# Patient Record
Sex: Female | Born: 1960 | Race: White | Hispanic: No | Marital: Married | Smoking: Never smoker
Health system: Southern US, Community
[De-identification: ages and names within clinical notes are randomized; demographics above are authoritative.]

## PROBLEM LIST (undated history)

## (undated) DIAGNOSIS — M1990 Unspecified osteoarthritis, unspecified site: Secondary | ICD-10-CM

## (undated) DIAGNOSIS — M8580 Other specified disorders of bone density and structure, unspecified site: Secondary | ICD-10-CM

## (undated) DIAGNOSIS — Z923 Personal history of irradiation: Secondary | ICD-10-CM

## (undated) DIAGNOSIS — C50919 Malignant neoplasm of unspecified site of unspecified female breast: Secondary | ICD-10-CM

## (undated) DIAGNOSIS — E785 Hyperlipidemia, unspecified: Secondary | ICD-10-CM

## (undated) HISTORY — PX: OTHER SURGICAL HISTORY: SHX169

## (undated) HISTORY — DX: Unspecified osteoarthritis, unspecified site: M19.90

## (undated) HISTORY — DX: Other specified disorders of bone density and structure, unspecified site: M85.80

## (undated) HISTORY — PX: PILONIDAL CYST EXCISION: SHX744

## (undated) HISTORY — DX: Hyperlipidemia, unspecified: E78.5

## (undated) HISTORY — PX: BREAST SURGERY: SHX581

## (undated) HISTORY — PX: COLONOSCOPY: SHX174

## (undated) HISTORY — DX: Malignant neoplasm of unspecified site of unspecified female breast: C50.919

---

## 1999-07-20 HISTORY — PX: AUGMENTATION MAMMAPLASTY: SUR837

## 2005-02-16 ENCOUNTER — Ambulatory Visit: Payer: Self-pay

## 2006-04-04 ENCOUNTER — Encounter: Payer: Self-pay | Admitting: Orthopedic Surgery

## 2006-04-06 ENCOUNTER — Ambulatory Visit: Payer: Self-pay

## 2006-04-18 ENCOUNTER — Encounter: Payer: Self-pay | Admitting: Orthopedic Surgery

## 2006-07-19 DIAGNOSIS — Z923 Personal history of irradiation: Secondary | ICD-10-CM

## 2006-07-19 HISTORY — PX: BREAST LUMPECTOMY: SHX2

## 2006-07-19 HISTORY — DX: Personal history of irradiation: Z92.3

## 2006-10-03 ENCOUNTER — Encounter: Admission: RE | Admit: 2006-10-03 | Discharge: 2006-10-03 | Payer: Self-pay | Admitting: General Surgery

## 2006-10-06 ENCOUNTER — Encounter: Admission: RE | Admit: 2006-10-06 | Discharge: 2006-10-06 | Payer: Self-pay | Admitting: General Surgery

## 2006-10-11 ENCOUNTER — Ambulatory Visit (HOSPITAL_BASED_OUTPATIENT_CLINIC_OR_DEPARTMENT_OTHER): Admission: RE | Admit: 2006-10-11 | Discharge: 2006-10-11 | Payer: Self-pay | Admitting: General Surgery

## 2006-10-11 ENCOUNTER — Encounter (INDEPENDENT_AMBULATORY_CARE_PROVIDER_SITE_OTHER): Payer: Self-pay | Admitting: *Deleted

## 2006-10-14 ENCOUNTER — Ambulatory Visit: Payer: Self-pay | Admitting: Oncology

## 2006-10-19 LAB — CBC WITH DIFFERENTIAL/PLATELET
BASO%: 0.6 % (ref 0.0–2.0)
LYMPH%: 27 % (ref 14.0–48.0)
MCHC: 34.4 g/dL (ref 32.0–36.0)
MONO#: 0.5 10*3/uL (ref 0.1–0.9)
MONO%: 6.7 % (ref 0.0–13.0)
Platelets: 276 10*3/uL (ref 145–400)
RBC: 4.85 10*6/uL (ref 3.70–5.32)
WBC: 6.7 10*3/uL (ref 3.9–10.0)

## 2006-10-19 LAB — COMPREHENSIVE METABOLIC PANEL
ALT: 13 U/L (ref 0–35)
AST: 14 U/L (ref 0–37)
Alkaline Phosphatase: 56 U/L (ref 39–117)
Sodium: 140 mEq/L (ref 135–145)
Total Bilirubin: 0.8 mg/dL (ref 0.3–1.2)
Total Protein: 6.7 g/dL (ref 6.0–8.3)

## 2006-10-19 LAB — CANCER ANTIGEN 27.29: CA 27.29: 17 U/mL (ref 0–39)

## 2006-10-26 ENCOUNTER — Ambulatory Visit (HOSPITAL_COMMUNITY): Admission: RE | Admit: 2006-10-26 | Discharge: 2006-10-26 | Payer: Self-pay | Admitting: Oncology

## 2006-11-15 ENCOUNTER — Ambulatory Visit: Admission: RE | Admit: 2006-11-15 | Discharge: 2007-01-13 | Payer: Self-pay | Admitting: Radiation Oncology

## 2007-01-02 ENCOUNTER — Ambulatory Visit: Payer: Self-pay | Admitting: Oncology

## 2007-01-02 LAB — COMPREHENSIVE METABOLIC PANEL
ALT: 22 U/L (ref 0–35)
AST: 22 U/L (ref 0–37)
BUN: 12 mg/dL (ref 6–23)
Creatinine, Ser: 0.71 mg/dL (ref 0.40–1.20)
Total Bilirubin: 0.6 mg/dL (ref 0.3–1.2)

## 2007-01-02 LAB — CBC WITH DIFFERENTIAL/PLATELET
BASO%: 0.4 % (ref 0.0–2.0)
EOS%: 1.7 % (ref 0.0–7.0)
HCT: 39.3 % (ref 34.8–46.6)
LYMPH%: 17.2 % (ref 14.0–48.0)
MCH: 30.5 pg (ref 26.0–34.0)
MCHC: 35.2 g/dL (ref 32.0–36.0)
MCV: 86.5 fL (ref 81.0–101.0)
MONO%: 10.4 % (ref 0.0–13.0)
NEUT%: 70.3 % (ref 39.6–76.8)
Platelets: 231 10*3/uL (ref 145–400)

## 2007-03-03 ENCOUNTER — Encounter (INDEPENDENT_AMBULATORY_CARE_PROVIDER_SITE_OTHER): Payer: Self-pay | Admitting: Obstetrics & Gynecology

## 2007-03-03 ENCOUNTER — Ambulatory Visit (HOSPITAL_COMMUNITY): Admission: RE | Admit: 2007-03-03 | Discharge: 2007-03-03 | Payer: Self-pay | Admitting: Obstetrics & Gynecology

## 2007-03-28 ENCOUNTER — Ambulatory Visit: Payer: Self-pay | Admitting: Oncology

## 2007-04-03 ENCOUNTER — Encounter: Admission: RE | Admit: 2007-04-03 | Discharge: 2007-04-03 | Payer: Self-pay | Admitting: Oncology

## 2007-06-11 ENCOUNTER — Ambulatory Visit: Payer: Self-pay | Admitting: Oncology

## 2007-06-14 LAB — CBC WITH DIFFERENTIAL/PLATELET
BASO%: 0.6 % (ref 0.0–2.0)
Eosinophils Absolute: 0 10*3/uL (ref 0.0–0.5)
HCT: 38.9 % (ref 34.8–46.6)
LYMPH%: 35.7 % (ref 14.0–48.0)
MCHC: 34.7 g/dL (ref 32.0–36.0)
MCV: 84 fL (ref 81.0–101.0)
MONO#: 0.3 10*3/uL (ref 0.1–0.9)
MONO%: 8.5 % (ref 0.0–13.0)
NEUT%: 53.8 % (ref 39.6–76.8)
Platelets: 222 10*3/uL (ref 145–400)
WBC: 3.4 10*3/uL — ABNORMAL LOW (ref 3.9–10.0)

## 2007-06-14 LAB — COMPREHENSIVE METABOLIC PANEL
Alkaline Phosphatase: 69 U/L (ref 39–117)
CO2: 25 mEq/L (ref 19–32)
Creatinine, Ser: 0.78 mg/dL (ref 0.40–1.20)
Glucose, Bld: 88 mg/dL (ref 70–99)
Total Bilirubin: 0.9 mg/dL (ref 0.3–1.2)

## 2007-06-14 LAB — CANCER ANTIGEN 27.29: CA 27.29: 12 U/mL (ref 0–39)

## 2007-06-14 LAB — LACTATE DEHYDROGENASE: LDH: 139 U/L (ref 94–250)

## 2007-06-20 LAB — VITAMIN D PNL(25-HYDRXY+1,25-DIHY)-BLD: Vit D, 25-Hydroxy: 41 ng/mL (ref 30–89)

## 2007-08-23 ENCOUNTER — Encounter: Payer: Self-pay | Admitting: Family Medicine

## 2007-10-04 ENCOUNTER — Ambulatory Visit: Payer: Self-pay | Admitting: Gastroenterology

## 2007-10-04 ENCOUNTER — Encounter: Admission: RE | Admit: 2007-10-04 | Discharge: 2007-10-04 | Payer: Self-pay | Admitting: Oncology

## 2007-10-17 ENCOUNTER — Ambulatory Visit: Payer: Self-pay | Admitting: Oncology

## 2007-10-19 LAB — CBC & DIFF AND RETIC
Eosinophils Absolute: 0 10*3/uL (ref 0.0–0.5)
HGB: 13.6 g/dL (ref 11.6–15.9)
IRF: 0.22 (ref 0.130–0.330)
MCV: 83.1 fL (ref 81.0–101.0)
MONO#: 0.3 10*3/uL (ref 0.1–0.9)
MONO%: 7 % (ref 0.0–13.0)
NEUT#: 2.2 10*3/uL (ref 1.5–6.5)
RBC: 4.62 10*6/uL (ref 3.70–5.32)
RDW: 13.4 % (ref 11.3–14.5)
RETIC #: 46.2 10*3/uL (ref 19.7–115.1)
Retic %: 1 % (ref 0.4–2.3)
WBC: 3.7 10*3/uL — ABNORMAL LOW (ref 3.9–10.0)
lymph#: 1.2 10*3/uL (ref 0.9–3.3)

## 2007-10-19 LAB — COMPREHENSIVE METABOLIC PANEL
AST: 20 U/L (ref 0–37)
Albumin: 4.4 g/dL (ref 3.5–5.2)
BUN: 13 mg/dL (ref 6–23)
CO2: 28 mEq/L (ref 19–32)
Calcium: 9.6 mg/dL (ref 8.4–10.5)
Chloride: 105 mEq/L (ref 96–112)
Glucose, Bld: 81 mg/dL (ref 70–99)
Potassium: 3.9 mEq/L (ref 3.5–5.3)

## 2007-11-20 ENCOUNTER — Ambulatory Visit: Payer: Self-pay | Admitting: Gastroenterology

## 2008-04-11 ENCOUNTER — Ambulatory Visit: Payer: Self-pay | Admitting: Family Medicine

## 2008-04-11 DIAGNOSIS — M1710 Unilateral primary osteoarthritis, unspecified knee: Secondary | ICD-10-CM

## 2008-04-11 DIAGNOSIS — G44209 Tension-type headache, unspecified, not intractable: Secondary | ICD-10-CM

## 2008-04-11 DIAGNOSIS — D0590 Unspecified type of carcinoma in situ of unspecified breast: Secondary | ICD-10-CM

## 2008-04-11 DIAGNOSIS — E7800 Pure hypercholesterolemia, unspecified: Secondary | ICD-10-CM

## 2008-04-12 LAB — CONVERTED CEMR LAB
ALT: 23 units/L (ref 0–35)
Albumin: 4 g/dL (ref 3.5–5.2)
BUN: 9 mg/dL (ref 6–23)
Bilirubin, Direct: 0.1 mg/dL (ref 0.0–0.3)
CO2: 31 meq/L (ref 19–32)
Calcium: 9.4 mg/dL (ref 8.4–10.5)
Cholesterol: 253 mg/dL (ref 0–200)
Creatinine, Ser: 0.7 mg/dL (ref 0.4–1.2)
Direct LDL: 204.3 mg/dL
Glucose, Bld: 87 mg/dL (ref 70–99)
Total Protein: 7.3 g/dL (ref 6.0–8.3)
Triglycerides: 58 mg/dL (ref 0–149)

## 2008-04-15 ENCOUNTER — Ambulatory Visit: Payer: Self-pay | Admitting: Family Medicine

## 2008-05-03 ENCOUNTER — Ambulatory Visit: Payer: Self-pay | Admitting: Oncology

## 2008-05-07 LAB — CBC WITH DIFFERENTIAL/PLATELET
EOS%: 1.4 % (ref 0.0–7.0)
LYMPH%: 33.2 % (ref 14.0–48.0)
MCH: 29.5 pg (ref 26.0–34.0)
MCV: 85.3 fL (ref 81.0–101.0)
MONO%: 8 % (ref 0.0–13.0)
Platelets: 206 10*3/uL (ref 145–400)
RBC: 4.71 10*6/uL (ref 3.70–5.32)
RDW: 13.3 % (ref 11.3–14.5)

## 2008-05-08 LAB — COMPREHENSIVE METABOLIC PANEL
Alkaline Phosphatase: 91 U/L (ref 39–117)
BUN: 14 mg/dL (ref 6–23)
CO2: 28 mEq/L (ref 19–32)
Creatinine, Ser: 0.82 mg/dL (ref 0.40–1.20)
Glucose, Bld: 72 mg/dL (ref 70–99)
Total Bilirubin: 0.6 mg/dL (ref 0.3–1.2)

## 2008-05-08 LAB — VITAMIN D 25 HYDROXY (VIT D DEFICIENCY, FRACTURES): Vit D, 25-Hydroxy: 42 ng/mL (ref 30–89)

## 2008-05-08 LAB — CANCER ANTIGEN 27.29: CA 27.29: 16 U/mL (ref 0–39)

## 2008-05-08 LAB — LACTATE DEHYDROGENASE: LDH: 142 U/L (ref 94–250)

## 2008-07-07 IMAGING — MG MM DIAGNOSTIC BILATERAL
9 of 12 series · 9 of 12 positions shown · non-contrast
Comparison: none

DG DIAGNOSTIC BILATERAL
Bilateral CC and MLO view(s) were taken.

LEFT BREAST ULTRASOUND
DIGITAL BILATERAL DIAGNOSTIC MAMMOGRAM AND LEFT BREAST ULTRASOUND:
CLINICAL DATA: Newly diagnosed left breast cancer.  Pre-MRI.

[R CC]
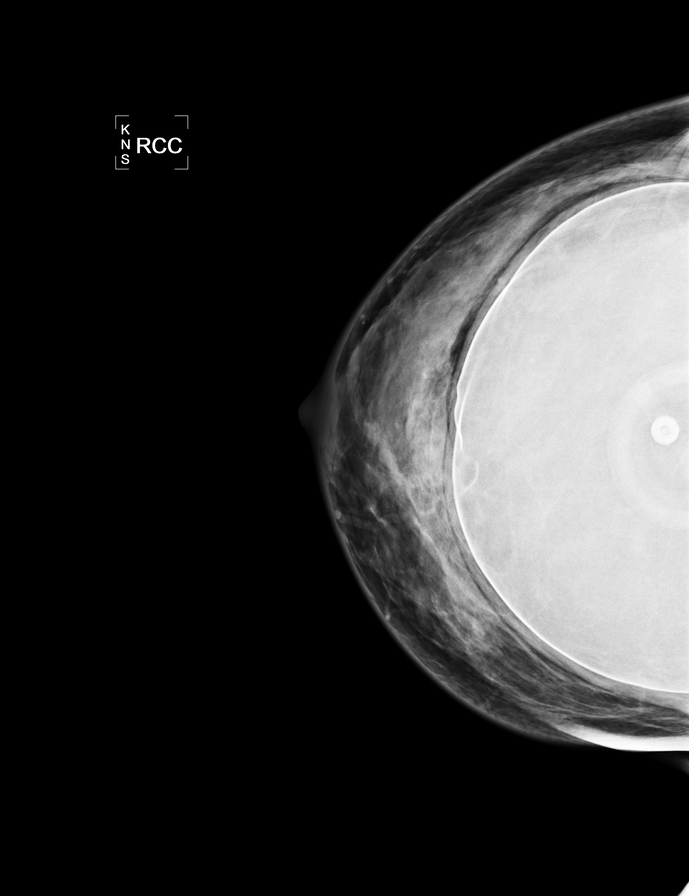

[L CC]
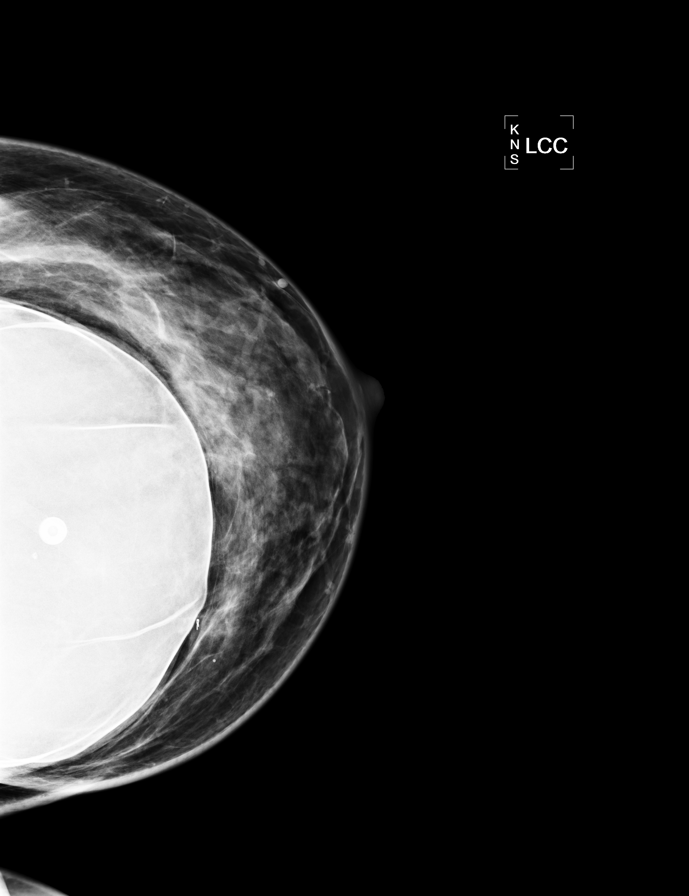

[L MLO]
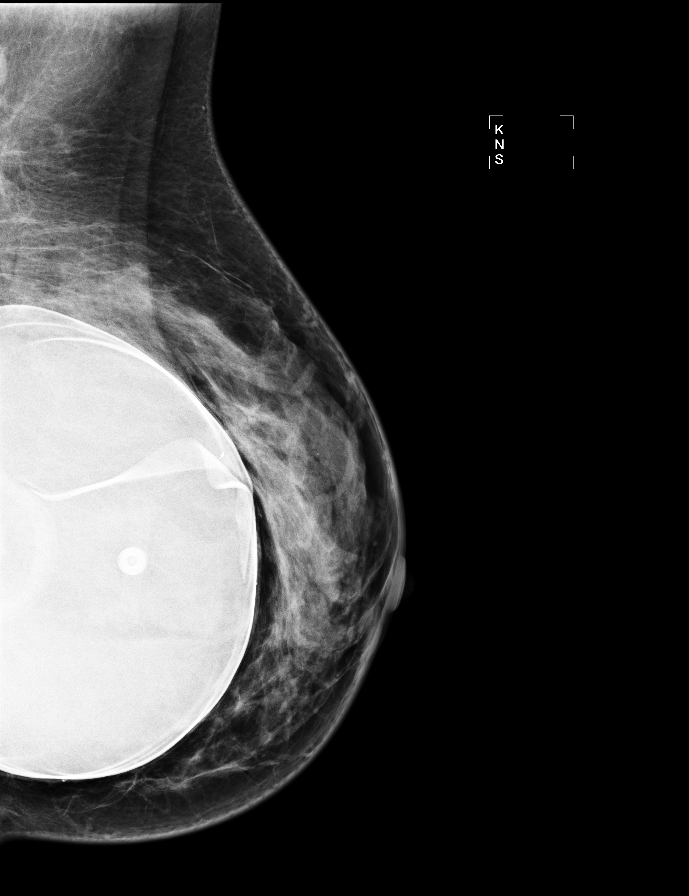

[R MLO]
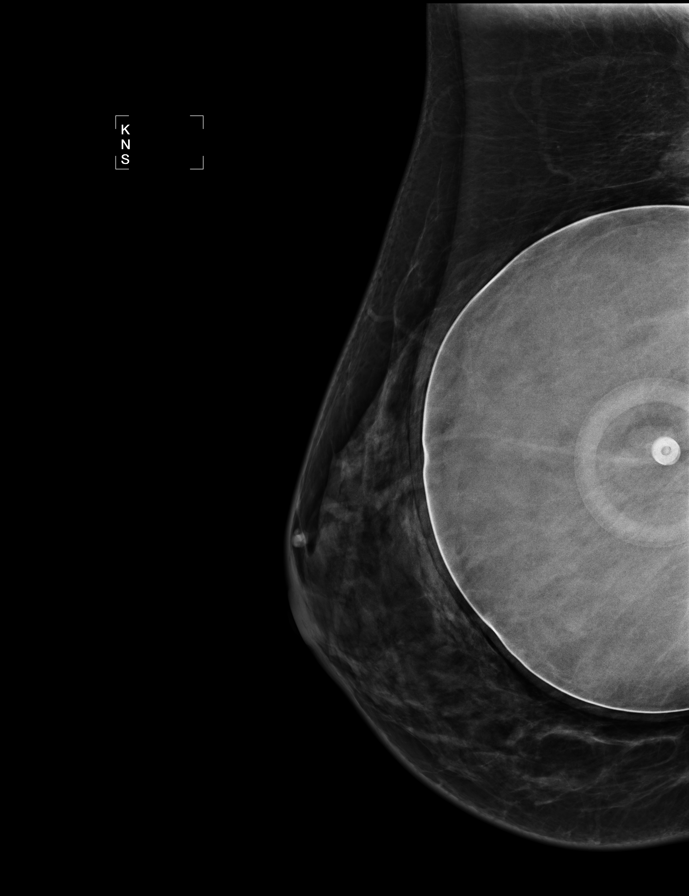

[R CCID]
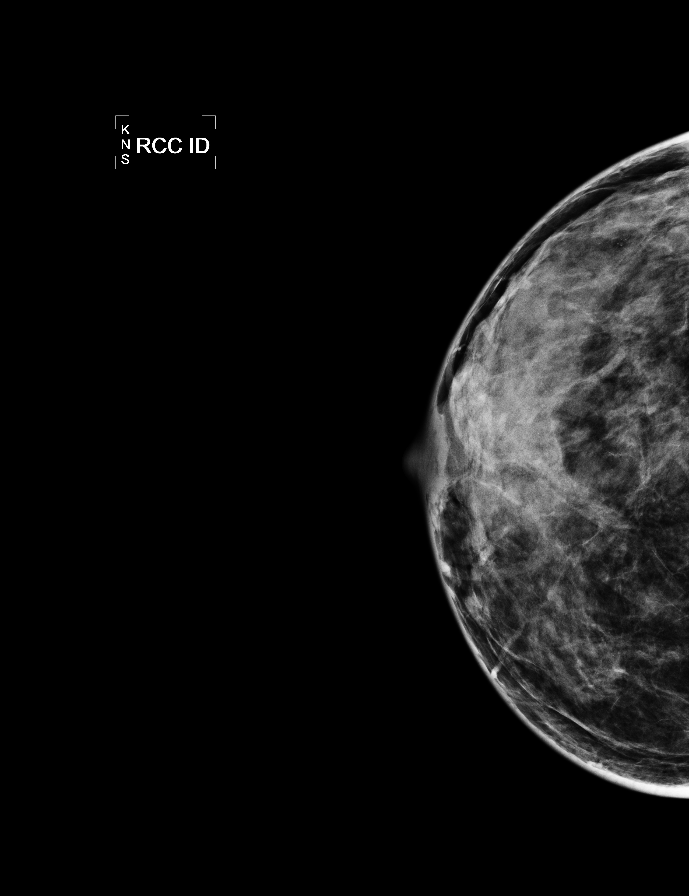

[R MLOID]
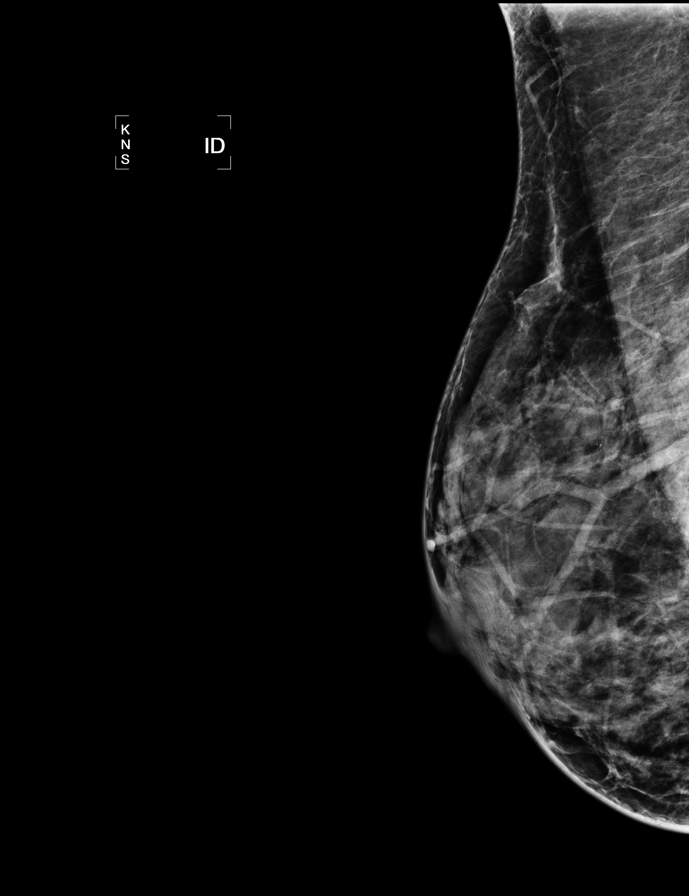

[L CCID]
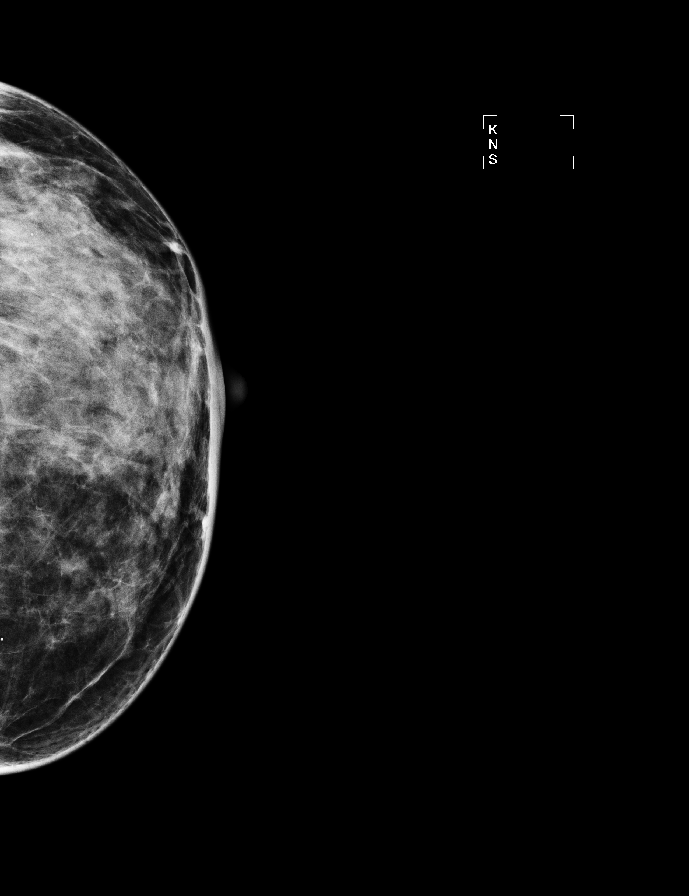

[L MLOID (1 of 2)]
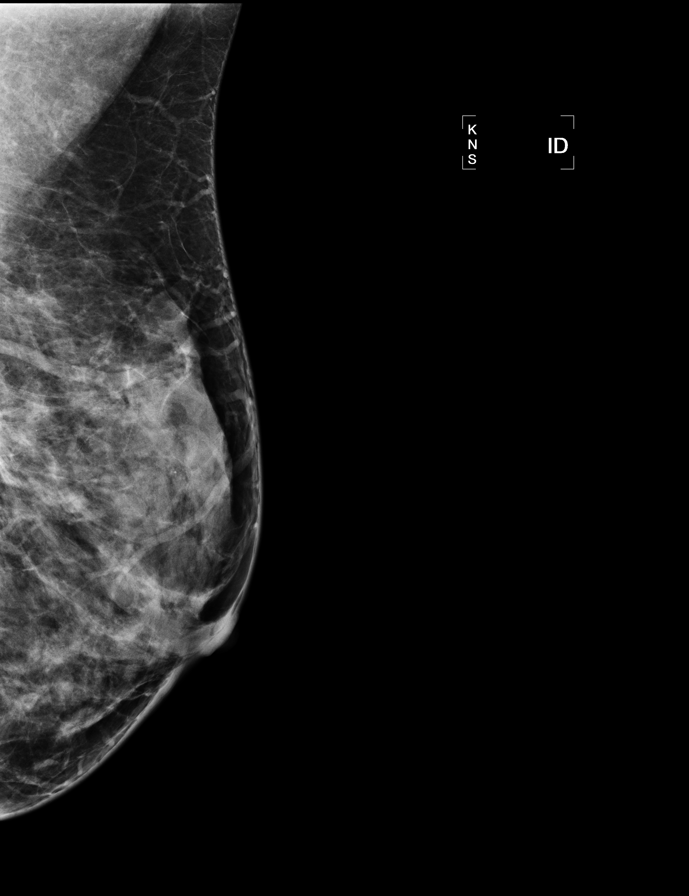

[L MLOID (2 of 2)]
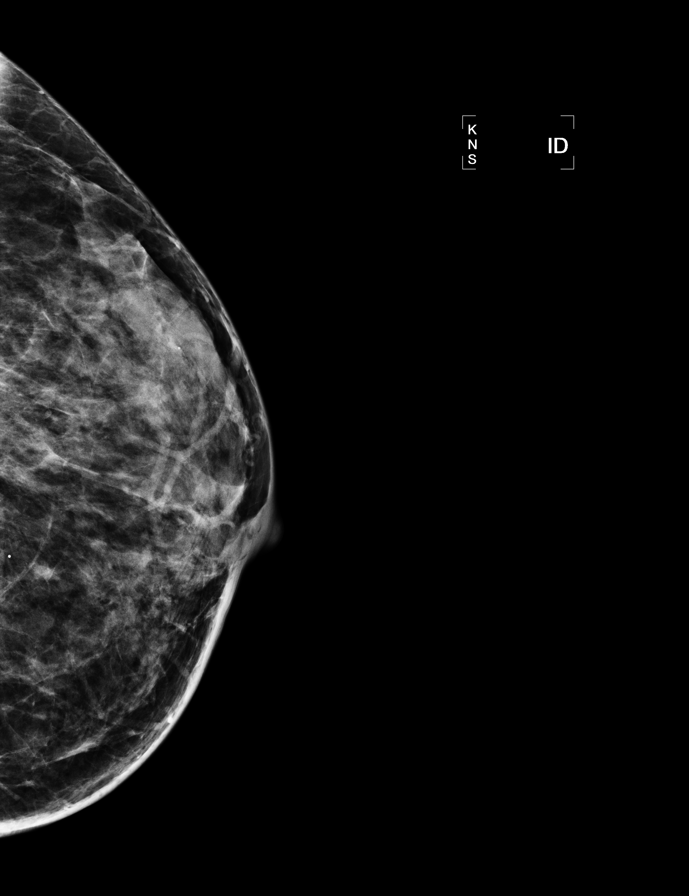

[9 of 12 positions shown; findings below may reference images not displayed]

Comparison studies are from [HOSPITAL] dated 04/06/06.

Standard and pushed back views were obtained.  There are bilateral subpectoral implants in place. 
There is a dense fibroglandular pattern. The right breast is negative.  Spot tangential view of the
area of concern in the left breast at 10 o'clock , approximately 10 cm from the nipple, 
demonstrates a poorly defined mass.  Within this mass there is a titanium marker clip from the 
patient's biopsy.  No additional masses or malignant-type microcalcifications are identified in the
left breast.

On physical exam, I palpate a discrete mass in the left breast at 10 o'clock approximately 7 cm 
from the nipple. Sonographically, there is an irregular hypoechoic mass measuring 0.7 x 0.6 x
cm.  Sonographic evaluation of the left axilla fails to reveal any enlarged adenopathy.
IMPRESSION: Left breast mass at 10 o'clock corresponding with the known malignancy. Treatment planning is 
recommended.  The patient has been scheduled for an MRI.

ASSESSMENT: Known biopsy proven malignancy - BI-RADS 6

Treatment plan.
,

## 2008-07-15 ENCOUNTER — Ambulatory Visit: Payer: Self-pay | Admitting: Family Medicine

## 2008-07-17 ENCOUNTER — Telehealth: Payer: Self-pay | Admitting: Family Medicine

## 2008-07-17 LAB — CONVERTED CEMR LAB
Cholesterol: 240 mg/dL (ref 0–200)
Total CHOL/HDL Ratio: 4.7

## 2008-07-22 ENCOUNTER — Telehealth: Payer: Self-pay | Admitting: Family Medicine

## 2008-08-26 ENCOUNTER — Encounter: Payer: Self-pay | Admitting: Family Medicine

## 2008-08-26 ENCOUNTER — Ambulatory Visit: Payer: Self-pay | Admitting: Family Medicine

## 2008-08-26 ENCOUNTER — Other Ambulatory Visit: Admission: RE | Admit: 2008-08-26 | Discharge: 2008-08-26 | Payer: Self-pay | Admitting: Family Medicine

## 2008-08-26 LAB — CONVERTED CEMR LAB
HDL goal, serum: 40 mg/dL
LDL Goal: 130 mg/dL

## 2008-08-28 ENCOUNTER — Encounter (INDEPENDENT_AMBULATORY_CARE_PROVIDER_SITE_OTHER): Payer: Self-pay | Admitting: *Deleted

## 2008-10-04 ENCOUNTER — Encounter: Admission: RE | Admit: 2008-10-04 | Discharge: 2008-10-04 | Payer: Self-pay | Admitting: Oncology

## 2008-10-14 ENCOUNTER — Encounter: Admission: RE | Admit: 2008-10-14 | Discharge: 2008-10-14 | Payer: Self-pay | Admitting: Oncology

## 2008-11-15 ENCOUNTER — Ambulatory Visit: Payer: Self-pay | Admitting: Oncology

## 2008-11-19 ENCOUNTER — Ambulatory Visit: Payer: Self-pay | Admitting: Family Medicine

## 2008-11-20 LAB — CONVERTED CEMR LAB
ALT: 22 units/L (ref 0–35)
HDL: 47.1 mg/dL (ref >39.00)

## 2009-05-19 ENCOUNTER — Ambulatory Visit: Payer: Self-pay | Admitting: Oncology

## 2009-05-21 LAB — LACTATE DEHYDROGENASE: LDH: 130 U/L (ref 94–250)

## 2009-05-21 LAB — CBC WITH DIFFERENTIAL/PLATELET
BASO%: 0.4 % (ref 0.0–2.0)
Eosinophils Absolute: 0 10*3/uL (ref 0.0–0.5)
LYMPH%: 31.1 % (ref 14.0–49.7)
MONO#: 0.3 10*3/uL (ref 0.1–0.9)
NEUT#: 2.7 10*3/uL (ref 1.5–6.5)
Platelets: 199 10*3/uL (ref 145–400)
RBC: 4.46 10*6/uL (ref 3.70–5.45)
RDW: 13.5 % (ref 11.2–14.5)
WBC: 4.4 10*3/uL (ref 3.9–10.3)
lymph#: 1.4 10*3/uL (ref 0.9–3.3)

## 2009-05-21 LAB — COMPREHENSIVE METABOLIC PANEL
ALT: 19 U/L (ref 0–35)
Albumin: 4 g/dL (ref 3.5–5.2)
CO2: 27 mEq/L (ref 19–32)
Calcium: 9.2 mg/dL (ref 8.4–10.5)
Chloride: 105 mEq/L (ref 96–112)
Glucose, Bld: 94 mg/dL (ref 70–99)
Potassium: 3.6 mEq/L (ref 3.5–5.3)
Sodium: 140 mEq/L (ref 135–145)
Total Protein: 7 g/dL (ref 6.0–8.3)

## 2009-05-22 LAB — CANCER ANTIGEN 27.29: CA 27.29: 17 U/mL (ref 0–39)

## 2009-05-30 ENCOUNTER — Ambulatory Visit: Payer: Self-pay | Admitting: Family Medicine

## 2009-06-02 LAB — CONVERTED CEMR LAB
HDL: 39.1 mg/dL (ref >39.00)
Total CHOL/HDL Ratio: 4
Triglycerides: 64 mg/dL (ref 0.0–149.0)

## 2009-10-06 ENCOUNTER — Encounter: Admission: RE | Admit: 2009-10-06 | Discharge: 2009-10-06 | Payer: Self-pay | Admitting: Oncology

## 2009-11-24 ENCOUNTER — Ambulatory Visit: Payer: Self-pay | Admitting: Oncology

## 2009-11-25 LAB — COMPREHENSIVE METABOLIC PANEL
ALT: 15 U/L (ref 0–35)
AST: 20 U/L (ref 0–37)
Albumin: 4.5 g/dL (ref 3.5–5.2)
Alkaline Phosphatase: 78 U/L (ref 39–117)
BUN: 12 mg/dL (ref 6–23)
Chloride: 103 mEq/L (ref 96–112)
Potassium: 3.8 mEq/L (ref 3.5–5.3)

## 2009-11-25 LAB — CBC WITH DIFFERENTIAL/PLATELET
BASO%: 0.4 % (ref 0.0–2.0)
Basophils Absolute: 0 10*3/uL (ref 0.0–0.1)
EOS%: 0.8 % (ref 0.0–7.0)
HGB: 14.1 g/dL (ref 11.6–15.9)
MCH: 30.2 pg (ref 25.1–34.0)
MCHC: 34.4 g/dL (ref 31.5–36.0)
MCV: 87.9 fL (ref 79.5–101.0)
MONO%: 5.9 % (ref 0.0–14.0)
RBC: 4.66 10*6/uL (ref 3.70–5.45)
RDW: 13.4 % (ref 11.2–14.5)
lymph#: 1.5 10*3/uL (ref 0.9–3.3)

## 2009-11-25 LAB — VITAMIN D 25 HYDROXY (VIT D DEFICIENCY, FRACTURES): Vit D, 25-Hydroxy: 33 ng/mL (ref 30–89)

## 2010-02-04 ENCOUNTER — Ambulatory Visit: Payer: Self-pay | Admitting: Family Medicine

## 2010-02-04 ENCOUNTER — Other Ambulatory Visit: Admission: RE | Admit: 2010-02-04 | Discharge: 2010-02-04 | Payer: Self-pay | Admitting: Family Medicine

## 2010-02-06 ENCOUNTER — Ambulatory Visit: Payer: Self-pay | Admitting: Family Medicine

## 2010-02-13 ENCOUNTER — Encounter (INDEPENDENT_AMBULATORY_CARE_PROVIDER_SITE_OTHER): Payer: Self-pay | Admitting: *Deleted

## 2010-02-13 LAB — CONVERTED CEMR LAB
ALT: 16 units/L (ref 0–35)
Albumin: 4 g/dL (ref 3.5–5.2)
Bilirubin, Direct: 0.1 mg/dL (ref 0.0–0.3)
Calcium: 9.3 mg/dL (ref 8.4–10.5)
Chloride: 105 meq/L (ref 96–112)
Creatinine, Ser: 0.7 mg/dL (ref 0.4–1.2)
GFR calc non Af Amer: 99.96 mL/min (ref >60)
HDL: 53.7 mg/dL (ref >39.00)
LDL Cholesterol: 128 mg/dL — ABNORMAL HIGH (ref 0–99)
Pap Smear: NEGATIVE
Total Bilirubin: 0.9 mg/dL (ref 0.3–1.2)
Total CHOL/HDL Ratio: 4
Total Protein: 6.8 g/dL (ref 6.0–8.3)
Triglycerides: 93 mg/dL (ref 0.0–149.0)

## 2010-05-29 ENCOUNTER — Ambulatory Visit: Payer: Self-pay | Admitting: Oncology

## 2010-06-02 LAB — COMPREHENSIVE METABOLIC PANEL
BUN: 11 mg/dL (ref 6–23)
CO2: 29 mEq/L (ref 19–32)
Calcium: 9.5 mg/dL (ref 8.4–10.5)
Chloride: 104 mEq/L (ref 96–112)
Creatinine, Ser: 0.7 mg/dL (ref 0.40–1.20)

## 2010-06-02 LAB — VITAMIN D 25 HYDROXY (VIT D DEFICIENCY, FRACTURES): Vit D, 25-Hydroxy: 33 ng/mL (ref 30–89)

## 2010-06-02 LAB — CBC WITH DIFFERENTIAL/PLATELET
Basophils Absolute: 0 10*3/uL (ref 0.0–0.1)
HCT: 40.8 % (ref 34.8–46.6)
HGB: 14.1 g/dL (ref 11.6–15.9)
LYMPH%: 33.1 % (ref 14.0–49.7)
MCH: 30.4 pg (ref 25.1–34.0)
MONO#: 0.3 10*3/uL (ref 0.1–0.9)
NEUT%: 59.6 % (ref 38.4–76.8)
Platelets: 228 10*3/uL (ref 145–400)
WBC: 4.9 10*3/uL (ref 3.9–10.3)
lymph#: 1.6 10*3/uL (ref 0.9–3.3)

## 2010-06-02 LAB — LACTATE DEHYDROGENASE: LDH: 139 U/L (ref 94–250)

## 2010-06-02 LAB — CANCER ANTIGEN 27.29: CA 27.29: 18 U/mL (ref 0–39)

## 2010-08-08 ENCOUNTER — Other Ambulatory Visit: Payer: Self-pay | Admitting: Oncology

## 2010-08-08 DIAGNOSIS — Z780 Asymptomatic menopausal state: Secondary | ICD-10-CM

## 2010-08-08 DIAGNOSIS — Z9889 Other specified postprocedural states: Secondary | ICD-10-CM

## 2010-08-08 DIAGNOSIS — Z853 Personal history of malignant neoplasm of breast: Secondary | ICD-10-CM

## 2010-08-18 NOTE — Miscellaneous (Signed)
  Medications Added SIMVASTATIN 40 MG TABS (SIMVASTATIN) take one tablet every  day       Clinical Lists Changes  Medications: Changed medication from SIMVASTATIN 40 MG TABS (SIMVASTATIN) take one tablet every other day to SIMVASTATIN 40 MG TABS (SIMVASTATIN) take one tablet every  day     Prior Medications: ARIMIDEX 1 MG TABS (ANASTROZOLE) take one by mouth daily FISH OIL   OIL (FISH OIL) 2000 mg divided daily CALCIUM CARBONATE-VITAMIN D 600-400 MG-UNIT  TABS (CALCIUM CARBONATE-VITAMIN D) 1 tab by mouth daily Current Allergies: No known allergies

## 2010-08-18 NOTE — Letter (Signed)
Summary: Results Follow up Letter  [place] [facility]  [address] [place], [place] [address]   Phone: [phone]  Fax: [phone]    02/13/2010 MRN: [id number]     [facility] [address] [place], [place]  [address]    Dear Ms. Wester,  The following are the results of your recent test(s):  Test         Result    Pap Smear:        Normal __x___  Not Normal _____ Comments:Repeat in 1 year ______________________________________________________ Cholesterol: LDL(Bad cholesterol):         Your goal is less than:         HDL (Good cholesterol):       Your goal is more than: Comments:  ______________________________________________________ Mammogram:        Normal _____  Not Normal _____ Comments:  ___________________________________________________________________ Hemoccult:        Normal _____  Not normal _______ Comments:    _____________________________________________________________________ Other Tests:    We routinely do not discuss normal results over the telephone.  If you desire a copy of the results, or you have any questions about this information we can discuss them at your next office visit.   Sincerely,   [name] MD

## 2010-08-18 NOTE — Assessment & Plan Note (Signed)
Summary: cpx/pap/dlo   Vital Signs:  Patient profile:   50 year old female Height:      62.5 inches Weight:      134.8 pounds BMI:     24.35 Temp:     98.1 degrees F oral Pulse rate:   76 / minute Pulse rhythm:   regular BP sitting:   100 / 64  (left arm) Cuff size:   regular  Vitals Entered By: Benny Lennert CMA Duncan Dull) (February 04, 2010 2:45 PM)  History of Present Illness: Chief complaint cpx with pap  The patient is here for annual wellness exam and preventative care.     In last few weeks..pain with intercourse...feels internal  Has history of genital warts...now has area in vaginal skin..she describes like ulcer..? if this was the painful area. This area she had only noted when she had recetn pain with intercourse.   She does not have concern about new exposure to STDs.  left breast carcinoma in situ...s/p lumpectomy. Dr Donnie Coffin..sees her every 6 months..  Dx in 2008. On arimedex.   High cholestreol...last checked in 05/2009.Marland Kitchen taking only every other day.  no yalgia.  Allergies (verified): No Known Drug Allergies  Past History:  Past medical, surgical, family and social histories (including risk factors) reviewed, and no changes noted (except as noted below).  Past Surgical History: Reviewed history from 04/11/2008 and no changes required. left lumpectomy 09/2006 radiation of left breast  x 6 weeks pilonidal cyst removal 1991  B oophrectomy 2008, for cancer prevention, still cervix and uterus  Family History: Reviewed history from 04/11/2008 and no changes required. father: CAD, MI age 4, DM, ?HTN, high chol mother: healthy brother: healthy MGM: colon cancer  Social History: Reviewed history from 04/11/2008 and no changes required. 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 CV:  Denies chest pain or discomfort. Resp:  Denies shortness of breath. GI:  Denies abdominal pain, bloody stools, constipation, and diarrhea. GU:  Denies abnormal vaginal bleeding, discharge, dysuria, and urinary frequency; In 09/2009...had urethral stricture..dilated by urology.  Physical Exam  General:  Well-developed,well-nourished,in no acute distress; alert,appropriate and cooperative throughout examination Ears:  External ear exam shows no significant lesions or deformities.  Otoscopic examination reveals clear canals, tympanic membranes are intact bilaterally without bulging, retraction, inflammation or discharge. Hearing is grossly normal bilaterally. Nose:  External nasal examination shows no deformity or inflammation. Nasal mucosa are pink and moist without lesions or exudates. Mouth:  Oral mucosa and oropharynx without lesions or exudates.  Teeth in good repair. Neck:  no carotid bruit or thyromegaly, no cervical or supraclavicular lymphadenopathy  Chest Wall:  No deformities, masses, or tenderness noted. Breasts:  Per ONC  Lungs:  Normal respiratory effort, chest expands symmetrically. Lungs are clear to auscultation, no crackles or wheezes. Heart:  Normal rate and regular rhythm. S1 and S2 normal without gallop, murmur, click, rub or other extra sounds. Abdomen:  Bowel sounds positive,abdomen soft and non-tender without masses, organomegaly or hernias noted. Genitalia:  Pelvic Exam:        External: normal female genitalia without lesions or masses        Vagina: normal without lesions or masses, mild vaginal dryness        Cervix: normal without lesions  or masses        Adnexa: normal bimanual exam without masses or fullness        Uterus: normal by palpation        Pap smear: performed Msk:  No deformity or scoliosis noted of thoracic or lumbar spine.   Pulses:  R and L posterior tibial pulses are full and equal  bilaterally  Extremities:  no edema Neurologic:  No cranial nerve deficits noted. Station and gait are normal. Plantar reflexes are down-going bilaterally. DTRs are symmetrical throughout. Sensory, motor and coordinative functions appear intact. Skin:  Intact without suspicious lesions or rashes Psych:  Cognition and judgment appear intact. Alert and cooperative with normal attention span and concentration. No apparent delusions, illusions, hallucinations   Impression & Recommendations:  Problem # 1:  Preventive Health Care (ICD-V70.0) The patient's preventative maintenance and recommended screening tests for an annual wellness exam were reviewed in full today. Brought up to date unless services declined.  Counselled on the importance of diet, exercise, and its role in overall health and mortality. The patient's FH and SH was reviewed, including their home life, tobacco status, and drug and alcohol status.     Problem # 2:  ROUTINE GYNECOLOGICAL EXAMINATION (ICD-V72.31) Vaginal issues likely due to decrease estrogen and vaginal atrophy...breast cancer was E2 positive..so premarin cream not recommended.  Will recommend vaginal KY jelly lubrication.   Complete Medication List: 1)  Arimidex 1 Mg Tabs (Anastrozole) .... Take one by mouth daily 2)  Fish Oil Oil (Fish oil) .... 2000 mg divided daily 3)  Calcium Carbonate-vitamin D 600-400 Mg-unit Tabs (Calcium carbonate-vitamin d) .Marland Kitchen.. 1 tab by mouth daily 4)  Simvastatin 40 Mg Tabs (Simvastatin) .... Take one tablet every other day  Other Orders: Tdap => 28yrs IM (04540) Admin 1st Vaccine (98119) Admin 1st Vaccine Horticulturist, commercial) [phone])  Patient Instructions: 1)  Fasting lipids, CMET Dx 272.0  Current Allergies (reviewed today): No known allergies    Past Surgical History:    Reviewed history from 04/11/2008 and no changes required:       left lumpectomy 09/2006       radiation of left breast  x 6 weeks       pilonidal cyst removal  1991        B oophrectomy 2008, for cancer prevention, still cervix and uterus   TD Next Due:  Refused Last Mammogram:  Normal Bilateral (02/17/2007 10:26:42 AM) Mammogram Result Date:  09/16/2009 Mammogram Result:  normal Mammogram Next Due:  1 yr      Tetanus/Td Vaccine    Vaccine Type: Tdap    Site: right deltoid    Mfr: GlaxoSmithKline    Dose: 0.5 ml    Route: IM    Given by: Benny Lennert CMA (AAMA)    Exp. Date: 10/11/2011    Lot #: [id number]    VIS given: 06/06/07 version given February 04, 2010.

## 2010-09-24 ENCOUNTER — Other Ambulatory Visit: Payer: Self-pay | Admitting: Oncology

## 2010-09-24 DIAGNOSIS — Z9889 Other specified postprocedural states: Secondary | ICD-10-CM

## 2010-10-08 ENCOUNTER — Ambulatory Visit
Admission: RE | Admit: 2010-10-08 | Discharge: 2010-10-08 | Disposition: A | Discharge status: Home / Self Care | Payer: BC Managed Care – PPO | Source: Ambulatory Visit | Attending: Oncology | Admitting: Oncology

## 2010-10-08 DIAGNOSIS — Z9889 Other specified postprocedural states: Secondary | ICD-10-CM

## 2010-10-08 DIAGNOSIS — Z853 Personal history of malignant neoplasm of breast: Secondary | ICD-10-CM

## 2010-10-08 DIAGNOSIS — Z780 Asymptomatic menopausal state: Secondary | ICD-10-CM

## 2010-10-09 ENCOUNTER — Other Ambulatory Visit: Payer: Self-pay | Admitting: Oncology

## 2010-10-09 ENCOUNTER — Ambulatory Visit
Admission: RE | Admit: 2010-10-09 | Discharge: 2010-10-09 | Disposition: A | Discharge status: Home / Self Care | Payer: BC Managed Care – PPO | Source: Ambulatory Visit | Attending: Oncology | Admitting: Oncology

## 2010-10-09 ENCOUNTER — Ambulatory Visit
Admission: RE | Admit: 2010-10-09 | Discharge: 2010-10-09 | Disposition: A | Discharge status: Home / Self Care | Payer: BC Managed Care – PPO | Source: Ambulatory Visit

## 2010-10-09 ENCOUNTER — Ambulatory Visit: Admission: RE | Admit: 2010-10-09 | Payer: BC Managed Care – PPO | Source: Ambulatory Visit

## 2010-10-09 DIAGNOSIS — Z9889 Other specified postprocedural states: Secondary | ICD-10-CM

## 2010-10-09 DIAGNOSIS — C50919 Malignant neoplasm of unspecified site of unspecified female breast: Secondary | ICD-10-CM

## 2010-10-09 MED ORDER — GADOBENATE DIMEGLUMINE 529 MG/ML IV SOLN
12.0000 mL | Freq: Once | INTRAVENOUS | Status: AC | PRN
  Administered 2010-10-09: 12 mL via INTRAVENOUS

## 2010-12-01 ENCOUNTER — Encounter (HOSPITAL_BASED_OUTPATIENT_CLINIC_OR_DEPARTMENT_OTHER): Payer: BC Managed Care – PPO | Admitting: Oncology

## 2010-12-01 ENCOUNTER — Other Ambulatory Visit: Payer: Self-pay | Admitting: Oncology

## 2010-12-01 ENCOUNTER — Telehealth: Payer: Self-pay | Admitting: *Deleted

## 2010-12-01 DIAGNOSIS — Z170 Estrogen receptor positive status [ER+]: Secondary | ICD-10-CM

## 2010-12-01 DIAGNOSIS — C50219 Malignant neoplasm of upper-inner quadrant of unspecified female breast: Secondary | ICD-10-CM

## 2010-12-01 LAB — COMPREHENSIVE METABOLIC PANEL
Albumin: 3.8 g/dL (ref 3.5–5.2)
BUN: 9 mg/dL (ref 6–23)
CO2: 29 mEq/L (ref 19–32)
Glucose, Bld: 108 mg/dL — ABNORMAL HIGH (ref 70–99)
Potassium: 3.4 mEq/L — ABNORMAL LOW (ref 3.5–5.3)
Sodium: 140 mEq/L (ref 135–145)
Total Bilirubin: 0.4 mg/dL (ref 0.3–1.2)
Total Protein: 6.8 g/dL (ref 6.0–8.3)

## 2010-12-01 LAB — CBC WITH DIFFERENTIAL/PLATELET
Basophils Absolute: 0 10*3/uL (ref 0.0–0.1)
Eosinophils Absolute: 0 10*3/uL (ref 0.0–0.5)
HGB: 13.1 g/dL (ref 11.6–15.9)
LYMPH%: 26.2 % (ref 14.0–49.7)
MONO#: 0.3 10*3/uL (ref 0.1–0.9)
NEUT#: 3.6 10*3/uL (ref 1.5–6.5)
Platelets: 192 10*3/uL (ref 145–400)
RBC: 4.4 10*6/uL (ref 3.70–5.45)
WBC: 5.3 10*3/uL (ref 3.9–10.3)

## 2010-12-01 LAB — VITAMIN D 25 HYDROXY (VIT D DEFICIENCY, FRACTURES): Vit D, 25-Hydroxy: 34 ng/mL (ref 30–89)

## 2010-12-01 NOTE — Assessment & Plan Note (Signed)
[facility]                         GASTROENTEROLOGY OFFICE NOTE   NKECHI, LINEHAN                       MRN:          [id number]  DATE:10/04/2007                            DOB:          November 02, 1960    CHIEF COMPLAINT:  50 year old white female self-referred for evaluation  of a lower abdominal pain and a family history of colon cancer.   HISTORY OF PRESENT ILLNESS:  Mrs. Guitron has a history of breast  cancer diagnosed in 2008.  She is status post a left breast lumpectomy  in March of 2008 and status post bilateral salpingo-oophorectomy in  August of 2008.  Over the past 3-4 months, she has noted mild lower  abdominal discomfort occasionally associated with gas and bloating.  She  notes no change in bowel habits, change stool caliber, melena,  hematochezia or weight loss.   FAMILY HISTORY:  Remarkable for maternal grandmother who developed colon  cancer at about age 32 and a maternal aunt with colon polyps at age 62.   PAST MEDICAL HISTORY:  1. Arthritis.  2. Allergic rhinitis.  3. Breast cancer status post radiation therapy.   PAST SURGICAL HISTORY:  1. Status post left breast lumpectomy March 2008.  2. Status post bilateral salpingo-oophorectomy August 2008.   CURRENT MEDICATIONS:  Listed on the chart, data reviewed.   MEDICATION ALLERGIES:  None known.   SOCIAL HISTORY:  Per the handwritten form.   REVIEW OF SYSTEMS:  Per the handwritten form.   PHYSICAL EXAMINATION:  Well-developed, well-nourished white female, no  acute distress.  Height 5 feet 3 inches.  Weight 143 pounds.  Blood  pressure is 118/80.  Pulse 78 and regular.  HEENT:  Anicteric sclerae .  Oropharynx clear.  CHEST:  Clear to auscultation bilaterally.  CARDIAC:  Regular rate and rhythm without murmurs appreciated.  ABDOMEN:  Soft, nontender, nondistended.  Normoactive bowel sounds.  No  palpable organomegaly, masses, or hernias.  RECTAL:  Deferred to time of  colonoscopy.  EXTREMITIES:  Without clubbing, cyanosis, or edema.  NEUROLOGIC:  Alert and oriented x3.  Grossly nonfocal.   ASSESSMENT AND PLAN:  Family history of colon cancer and colon polyps,  mild lower abdominal pain, status post bilateral salpingo-oophorectomy.  Rule out colorectal neoplasms.  She may have adhesions related to her oophorectomy causing mild lower  abdominal pain.  Risk, benefits and alternatives to colonoscopy,  possible biopsy and possible polypectomy discussed with the patient, and  she consents to proceed.  This will be scheduled electively.     Venita Lick. Russella Dar, MD, [facility]  Electronically Signed    MTS/MedQ  DD: 10/05/2007  DT: 10/05/2007  Job #: [id number]

## 2010-12-01 NOTE — Op Note (Signed)
[name], [name]              ACCOUNT NO.:  [id number]   MEDICAL RECORD NO.:  [id number]          PATIENT TYPE:  AMB   LOCATION:  SDC                           FACILITY:  [facility]   PHYSICIAN:  Genia Del, M.D.DATE OF BIRTH:  [birth year]   DATE OF PROCEDURE:  03/03/2007  DATE OF DISCHARGE:                               OPERATIVE REPORT   PREOPERATIVE DIAGNOSIS:  Left breast cancer with positive estrogen  receptors.   POSTOPERATIVE DIAGNOSIS:  Left breast cancer with positive estrogen  receptors.   PROCEDURE NOTE:  Bilateral salpingo-oophorectomy by laparoscopy.   SURGEON:  Dr. Genia Del.   ASSISTANT:  None.   PROCEDURE:  Under general anesthesia with endotracheal intubation, the  patient is in lithotomy position.  She is prepped with Betadine on the  abdominal, suprapubic, vulvar and vaginal areas and draped as usual.  A  Foley catheter is inserted.  The vaginal exam reveals an anteverted  uterus, normal volume, no adnexal mass.  The speculum is introduced in  the vagina.  The uterus is cannulated and the speculum is removed.  Abdominally, we make a 1.5 cm infraumbilical incision with the scalpel  after infiltrating with Marcaine.  We opened the aponeurosis with Mayo  scissors and opened the parietal peritoneum bluntly with a finger.  We  put a pursestring stitch of Vicryl 0 at the aponeurosis.  We insert the  Luling under direct vision and then the camera.  We create a  pneumoperitoneum with CO2.  We put two contralateral trocars, one in the  right iliac area and one in the left iliac area.  We make a 5 mm  incision with a scalpel after Marcaine infiltration and insert the 5 mm  trocars on either side under direct vision.  The uterus is normal in  size and appearance.  Both ovaries are normal in size and appearance.  Both tubes are normal in size and appearance.  No lesion is seen in the  pelvis.  The abdomen is grossly normal.  No adhesion is present.  We  visualize the ureters on each side and they are in normal anatomic  position.  We start on the left side.  We cauterize and section the left  infundibulopelvic ligament.  We continued to cauterize below the ovary  and the tube and section successively until we reach close to the left  proximal part of the tube.  We then cauterize and section the tube  proximally.  We then cauterize and section the left utero-ovarian  ligament.  We detach the left adnexa completely and put it in the  posterior cul-de-sac.  We proceed exactly the same way on the right  side.  We cauterize and section the right infundibulopelvic ligament,  follow under the tube and ovary.  We cauterize and section the proximal  part of the right tube and then we cauterize and section the right utero-  ovarian ligament.  We detach the right adnexa completely.  We then  switch to a 5 mm camera and put the Endobag in the infraumbilical port.  We put the two adnexae in the  bag and pull them out of the pelvic  cavity.  It is sent to pathology as one specimen.  We then irrigate and  suction the abdominopelvic cavity.  Hemostasis is adequate on all  pedicles.  We took pictures before the BSO and after the BSO.  We remove  all instruments.  We evacuate the CO2.  The trocars are removed under  direct vision.  We then attach the pursestring stitch at the  infraumbilical incision.  We close the skin with a Vicryl 0 and  add Dermabond on all incisions.  The instrument is removed vaginally.  The count of instruments and sponges was complete.  The estimated blood  loss was minimal.  No complications occurred and the patient was brought  to recovery room in good stable status.      Genia Del, M.D.  Electronically Signed     ML/MEDQ  D:  03/03/2007  T:  03/04/2007  Job:  [id number]

## 2010-12-01 NOTE — Telephone Encounter (Signed)
PA requested for Nexium 40mg  Cap PA form  requested w/[employer] 11/20/10 Form completed and faxed for approval 11/25/10 Approval recvd & faxed to pharmacy 11/25/10.

## 2010-12-04 NOTE — Op Note (Signed)
NAMEMONSERRATE, BLASCHKE              ACCOUNT NO.:  [id number]   MEDICAL RECORD NO.:  [id number]          PATIENT TYPE:  AMB   LOCATION:  DSC                          FACILITY:  [facility]   PHYSICIAN:  Timothy E. Earlene Plater, M.D. DATE OF BIRTH:  1961/05/13   DATE OF PROCEDURE:  10/11/2006  DATE OF DISCHARGE:                               OPERATIVE REPORT   PREOPERATIVE DIAGNOSIS:  Carcinoma of left breast.   POSTOPERATIVE DIAGNOSIS:  Carcinoma of left breast.   PROCEDURE:  1. Left breast lumpectomy.  2. Sentinel node biopsy was blue dye injection.   SURGEON:  Kendrick Ranch, MD   ANESTHESIA:  General.   INDICATION:  Ms. Boccio is 63, otherwise healthy, no contributing  factors or family factors, who developed a palpable mass in the left  breast, was seen and evaluated in [place], where a core biopsy did in  fact showing invasive carcinoma.  She elected to come to [place].  She has been evaluated here with repeat mammograms and ultrasound for  confirmation and an MRI, which was negative except for the one spot at  10 o'clock, left breast.  She has been fully informed, as have her  family, and she is ready to proceed with the surgery as indicated.  Prior to surgery she was injected with technetium in the left nipple-  areolar complex.  She was seen, identified and the left breast marked.  Then she was taken to the operating room.  She positioned herself supine  and general LMA anesthesia provided.  The left nipple-areolar complex  was injected with 3 mL of blue dye and then gently massaged for 5  minutes.  The breast was prepped and draped in the usual fashion.  The  palpable lesion in the left breast had been previously marked by both  the patient and myself; it was at the 10 o'clock position in the left  breast.  It was still easily palpable.  This area was injected with  Marcaine and then a complete excision was accomplished along with an  ellipse of skin down to the pectoral fascia  which overlay her breast  implants.  A generous lumpectomy was accomplished.  Bleeding points were  cauterized.  The wound was dry.  It was carefully marked as:  Skin,  anterior surface; 1 string, posterior surface; 2 strings, inferior.  This was sent to Pathology.  Meanwhile, the wound was reapproximated in  3 layers.   Attention was turned to the axilla.  There was 1 hot spot by skin  scanning, no other spots noted on the chest area.  This area was  anesthetized with Marcaine and then a short incision made, the deep  axillary space entered and an obvious blue node was seen, grasped,  dissected free; it was hot and blue, labeled #1.  A second node was also  found with the NeoProbe; it was hot and blue; this was excised and sent  as #2.  The remainder of the axilla was negative for Neoprobe counts.  These were sent as #1 and #2.  I did examine the axilla both manually  and  visually and there was no complication or other notable nodes.  The  axilla was also closed in layers.   Both wounds were cleansed and each was closed separately with 3-0  Monocryl.  We waited until we had full reports from Dr. Berneta Levins  showing negative margins on the breast tissue and 2 negative sentinel  nodes.  So, the patient was awakened after Steri-Strips and dry sterile  dressings applied and final counts correct.  She was removed to recovery  room in good condition.  She will be discharged today and followed as an  outpatient.Sheppard Plumber. Earlene Plater, M.D.  Electronically Signed     TED/MEDQ  D:  10/11/2006  T:  10/11/2006  Job:  [id number]   cc:   [facility] [place] [facility]  [place], [place] Adela Glimpse MD

## 2010-12-08 ENCOUNTER — Encounter (HOSPITAL_BASED_OUTPATIENT_CLINIC_OR_DEPARTMENT_OTHER): Payer: BC Managed Care – PPO | Admitting: Oncology

## 2010-12-08 DIAGNOSIS — C50219 Malignant neoplasm of upper-inner quadrant of unspecified female breast: Secondary | ICD-10-CM

## 2011-01-08 ENCOUNTER — Other Ambulatory Visit: Payer: Self-pay | Admitting: Family Medicine

## 2011-01-08 NOTE — Telephone Encounter (Signed)
Patient not seen hear since 2010

## 2011-01-10 NOTE — Telephone Encounter (Signed)
No refills until appt for CPX made.

## 2011-01-11 NOTE — Telephone Encounter (Signed)
Patient not seen in over 1 year please advise

## 2011-01-12 MED ORDER — SIMVASTATIN 40 MG PO TABS: 40.0000 mg | ORAL_TABLET | Freq: Every day | ORAL | Status: DC

## 2011-01-12 NOTE — Telephone Encounter (Signed)
Patient made appt for cpx July 26th

## 2011-02-09 ENCOUNTER — Encounter: Payer: Self-pay | Admitting: *Deleted

## 2011-02-11 ENCOUNTER — Encounter: Payer: Self-pay | Admitting: Family Medicine

## 2011-02-11 ENCOUNTER — Ambulatory Visit (INDEPENDENT_AMBULATORY_CARE_PROVIDER_SITE_OTHER): Payer: BC Managed Care – PPO | Admitting: Family Medicine

## 2011-02-11 VITALS — BP 90/60 | HR 60 | Temp 98.1°F | Ht 63.0 in | Wt 138.5 lb

## 2011-02-11 DIAGNOSIS — Z0000 Encounter for general adult medical examination without abnormal findings: Secondary | ICD-10-CM

## 2011-02-11 DIAGNOSIS — E7800 Pure hypercholesterolemia, unspecified: Secondary | ICD-10-CM

## 2011-02-11 DIAGNOSIS — Z01419 Encounter for gynecological examination (general) (routine) without abnormal findings: Secondary | ICD-10-CM

## 2011-02-11 NOTE — Patient Instructions (Addendum)
Make sure to take Ca and vit D... 600 mg/400 Units TWICE a day. Return for fasting lipids in next few months.

## 2011-02-11 NOTE — Progress Notes (Signed)
Subjective:    Patient ID: [name], female    DOB: [birth year], 50 y.o.   MRN: [id number]  HPI The patient is here for annual wellness exam and preventative care.    Feeling well overall.   Elevated Cholesterol:On fish oil and simvastatin 40 mg daily. Using medications without problems: Muscle aches:  Other complaints:  left breast carcinoma in situ...s/p lumpectomy.  Dr Donnie Coffin..sees him every 6 months. Dx in 2008.  On arimedex.   Has history of BSO due to risk of ovarian cancer. Still has uterus and ovaries.   Review of Systems  Constitutional: Negative for fever, fatigue and unexpected weight change.  HENT: Negative for ear pain, congestion, sore throat, sneezing, trouble swallowing and sinus pressure.   Eyes: Negative for pain and itching.  Respiratory: Negative for cough, shortness of breath and wheezing.   Cardiovascular: Negative for chest pain, palpitations and leg swelling.  Gastrointestinal: Negative for nausea, abdominal pain, diarrhea, constipation and blood in stool.  Genitourinary: Negative for dysuria, hematuria, vaginal discharge, difficulty urinating and menstrual problem.  Skin: Negative for rash.  Neurological: Negative for syncope, weakness, light-headedness, numbness and headaches.  Psychiatric/Behavioral: Negative for confusion and dysphoric mood. The patient is not nervous/anxious.        Objective:   Physical Exam  Constitutional: Vital signs are normal. She appears well-developed and well-nourished. She is cooperative.  Non-toxic appearance. She does not appear ill. No distress.  HENT:  Head: Normocephalic.  Right Ear: Hearing, tympanic membrane, external ear and ear canal normal.  Left Ear: Hearing, tympanic membrane, external ear and ear canal normal.  Nose: Nose normal.  Eyes: Conjunctivae, EOM and lids are normal. Pupils are equal, round, and reactive to light. No foreign bodies found.  Neck: Trachea normal and normal range of motion.  Neck supple. Carotid bruit is not present. No mass and no thyromegaly present.  Cardiovascular: Normal rate, regular rhythm, S1 normal, S2 normal, normal heart sounds and intact distal pulses.  Exam reveals no gallop.   No murmur heard. Pulmonary/Chest: Effort normal and breath sounds normal. No respiratory distress. She has no wheezes. She has no rhonchi. She has no rales.  Abdominal: Soft. Normal appearance and bowel sounds are normal. She exhibits no distension, no fluid wave, no abdominal bruit and no mass. There is no hepatosplenomegaly. There is no tenderness. There is no rebound, no guarding and no CVA tenderness. No hernia.  Genitourinary: Vagina normal. Pelvic exam was performed with patient prone. No labial fusion. There is no rash, tenderness, lesion or injury on the right labia. There is no rash, tenderness, lesion or injury on the left labia. Uterus is not enlarged and not tender.       Breast exam per Donnie Coffin  No pap, only DVE performed  Lymphadenopathy:    She has no cervical adenopathy.    She has no axillary adenopathy.  Neurological: She is alert. She has normal strength. No cranial nerve deficit or sensory deficit.  Skin: Skin is warm, dry and intact. No rash noted.  Psychiatric: Her speech is normal and behavior is normal. Judgment normal. Her mood appears not anxious. Cognition and memory are normal. She does not exhibit a depressed mood.          Assessment & Plan:  Complete Physical Exam; The patient's preventative maintenance and recommended screening tests for an annual wellness exam were reviewed in full today. Brought up to date unless services declined.  Counselled on the importance of diet, exercise, and its role  in overall health and mortality. The patient's FH and SH was reviewed, including their home life, tobacco status, and drug and alcohol status.   Has had 3 paps in a row 2009, 10, 11. Continue DVE given uterus still present. Start spacing paps to every  2 years.. Skip this year.  Breast exam at Dr. Gita Kudo [facility] every 2 years.. Last in 2012 nml.

## 2011-02-11 NOTE — Assessment & Plan Note (Addendum)
Due for reeval. LFTs nml on 5/15 labs. Continue current meds. Encouraged exercise, weight loss, healthy eating habits. Of note she is interested in lowering simvastatin if able to with these labs.

## 2011-02-15 ENCOUNTER — Other Ambulatory Visit (INDEPENDENT_AMBULATORY_CARE_PROVIDER_SITE_OTHER): Payer: BC Managed Care – PPO

## 2011-02-15 DIAGNOSIS — E7800 Pure hypercholesterolemia, unspecified: Secondary | ICD-10-CM

## 2011-02-15 LAB — LIPID PANEL
HDL: 57.8 mg/dL (ref >39.00)
LDL Cholesterol: 114 mg/dL — ABNORMAL HIGH (ref 0–99)
Total CHOL/HDL Ratio: 3
VLDL: 16 mg/dL (ref 0.0–40.0)

## 2011-02-16 ENCOUNTER — Telehealth: Payer: Self-pay | Admitting: Family Medicine

## 2011-02-16 NOTE — Telephone Encounter (Signed)
Cholesterol not as low as in the past.. Goal is less than 130... She is currently LDL at 118... I would be concerned if she decreased cholesterol med it would go over goal. Continue working on getting back down to 90s where she has had it in the past... If we get it there we can lower chol med. Can recheckin 6 mo to a year.

## 2011-02-17 NOTE — Telephone Encounter (Signed)
Patient advised and copy of lab sent to patients home address

## 2011-05-03 LAB — CBC
Platelets: 231
RBC: 5.04
WBC: 5.6

## 2011-06-09 ENCOUNTER — Other Ambulatory Visit (HOSPITAL_BASED_OUTPATIENT_CLINIC_OR_DEPARTMENT_OTHER): Payer: BC Managed Care – PPO | Admitting: Lab

## 2011-06-09 ENCOUNTER — Other Ambulatory Visit: Payer: Self-pay | Admitting: Oncology

## 2011-06-09 DIAGNOSIS — Z170 Estrogen receptor positive status [ER+]: Secondary | ICD-10-CM

## 2011-06-09 DIAGNOSIS — C50219 Malignant neoplasm of upper-inner quadrant of unspecified female breast: Secondary | ICD-10-CM

## 2011-06-09 DIAGNOSIS — E559 Vitamin D deficiency, unspecified: Secondary | ICD-10-CM

## 2011-06-09 LAB — CBC WITH DIFFERENTIAL/PLATELET
Basophils Absolute: 0 10*3/uL (ref 0.0–0.1)
EOS%: 0.6 % (ref 0.0–7.0)
Eosinophils Absolute: 0 10*3/uL (ref 0.0–0.5)
HGB: 13.8 g/dL (ref 11.6–15.9)
LYMPH%: 32.3 % (ref 14.0–49.7)
MCH: 29.7 pg (ref 25.1–34.0)
MCV: 87.2 fL (ref 79.5–101.0)
MONO%: 7.2 % (ref 0.0–14.0)
NEUT#: 2.9 10*3/uL (ref 1.5–6.5)
Platelets: 215 10*3/uL (ref 145–400)
RBC: 4.64 10*6/uL (ref 3.70–5.45)
RDW: 13 % (ref 11.2–14.5)

## 2011-06-09 LAB — COMPREHENSIVE METABOLIC PANEL
AST: 23 U/L (ref 0–37)
Albumin: 4.2 g/dL (ref 3.5–5.2)
Alkaline Phosphatase: 74 U/L (ref 39–117)
BUN: 11 mg/dL (ref 6–23)
Creatinine, Ser: 0.69 mg/dL (ref 0.50–1.10)
Glucose, Bld: 89 mg/dL (ref 70–99)
Potassium: 4.2 mEq/L (ref 3.5–5.3)
Total Bilirubin: 0.4 mg/dL (ref 0.3–1.2)

## 2011-06-09 LAB — VITAMIN D 25 HYDROXY (VIT D DEFICIENCY, FRACTURES): Vit D, 25-Hydroxy: 36 ng/mL (ref 30–89)

## 2011-06-17 ENCOUNTER — Ambulatory Visit (HOSPITAL_BASED_OUTPATIENT_CLINIC_OR_DEPARTMENT_OTHER): Payer: BC Managed Care – PPO | Admitting: Oncology

## 2011-06-17 DIAGNOSIS — Z1231 Encounter for screening mammogram for malignant neoplasm of breast: Secondary | ICD-10-CM

## 2011-06-17 DIAGNOSIS — Z170 Estrogen receptor positive status [ER+]: Secondary | ICD-10-CM

## 2011-06-17 DIAGNOSIS — D0590 Unspecified type of carcinoma in situ of unspecified breast: Secondary | ICD-10-CM

## 2011-06-17 DIAGNOSIS — E559 Vitamin D deficiency, unspecified: Secondary | ICD-10-CM

## 2011-06-17 DIAGNOSIS — C50219 Malignant neoplasm of upper-inner quadrant of unspecified female breast: Secondary | ICD-10-CM

## 2011-06-17 NOTE — Progress Notes (Signed)
Hematology and Oncology Follow Up Visit  [name] [id number] [birth year] 50 y.o. 06/17/2011 3:37 PM   Principle Diagnosis: 50 yo woman with hx of er+ breast cancer N-, s/p lumpectomy 2008 on arimidex.  Interim History:  No current complaints, feels tenderness rt jaw  Medications: I have reviewed the patient's current medications.  Allergies: No Known Allergies  Past Medical History, Surgical history, Social history, and Family History were reviewed and updated.  Review of Systems: Constitutional:  Negative for fever, chills, night sweats, anorexia, weight loss, pain. Cardiovascular: no chest pain or dyspnea on exertion Respiratory: no cough, shortness of breath, or wheezing Neurological: no TIA or stroke symptoms Dermatological: negative ENT: negative Skin Gastrointestinal: no abdominal pain, change in bowel habits, or black or bloody stools Genito-Urinary: no dysuria, trouble voiding, or hematuria Hematological and Lymphatic: negative Breast: negative for breast lumps Musculoskeletal: negative Remaining ROS negative.  Physical Exam: Blood pressure 115/80, pulse 80, temperature 97.7 F (36.5 C), height 5' 2.3" (1.582 m), weight 144 lb 12.8 oz (65.681 kg). ECOG: 0 General appearance: alert, cooperative and appears stated age Head: Normocephalic, without obvious abnormality, atraumatic Neck: no adenopathy, no carotid bruit, no JVD, supple, symmetrical, trachea midline and thyroid not enlarged, symmetric, no tenderness/mass/nodules Lymph nodes: Cervical, supraclavicular, and axillary nodes normal. Cardiac : nl Pulmonary:nl Breasts:s/p bilateral implants, no masses Abdomen:nl Extremities nl Neuro:nl  Lab Results: Lab Results  Component Value Date   WBC 4.8 06/09/2011   HGB 13.8 06/09/2011   HCT 40.4 06/09/2011   MCV 87.2 06/09/2011   PLT 215 06/09/2011     Chemistry      Component Value Date/Time   NA 144 06/09/2011 1428   NA 144 06/09/2011 1428   NA 144  06/09/2011 1428   K 4.2 06/09/2011 1428   K 4.2 06/09/2011 1428   K 4.2 06/09/2011 1428   CL 108 06/09/2011 1428   CL 108 06/09/2011 1428   CL 108 06/09/2011 1428   CO2 29 06/09/2011 1428   CO2 29 06/09/2011 1428   CO2 29 06/09/2011 1428   BUN 11 06/09/2011 1428   BUN 11 06/09/2011 1428   BUN 11 06/09/2011 1428   CREATININE 0.69 06/09/2011 1428   CREATININE 0.69 06/09/2011 1428   CREATININE 0.69 06/09/2011 1428      Component Value Date/Time   CALCIUM 9.0 06/09/2011 1428   CALCIUM 9.0 06/09/2011 1428   CALCIUM 9.0 06/09/2011 1428   ALKPHOS 74 06/09/2011 1428   ALKPHOS 74 06/09/2011 1428   ALKPHOS 74 06/09/2011 1428   AST 23 06/09/2011 1428   AST 23 06/09/2011 1428   AST 23 06/09/2011 1428   ALT 18 06/09/2011 1428   ALT 18 06/09/2011 1428   ALT 18 06/09/2011 1428   BILITOT 0.4 06/09/2011 1428   BILITOT 0.4 06/09/2011 1428   BILITOT 0.4 06/09/2011 1428       Radiological Studies: chest X-ray n/a Mammogram Pending 3/13 Bone density n/a  Impression and Plan: Doing well, somefatigue , weight gain, cold extremeties, suggest thyroid testing. F/u mammogram In 3/13 Suggest genetic testing given age at diagnosis  More than 50% of the visit was spent in patient-related counselling   Pierce Crane, MD 11/29/20123:37 PM

## 2011-06-18 ENCOUNTER — Telehealth: Payer: Self-pay | Admitting: Oncology

## 2011-06-18 NOTE — Telephone Encounter (Signed)
sent orders back to MD to infom him that the diag need to be changed to history of breast cancer in order for me to schedule appt

## 2011-06-30 ENCOUNTER — Other Ambulatory Visit: Payer: Self-pay | Admitting: Oncology

## 2011-06-30 DIAGNOSIS — D0590 Unspecified type of carcinoma in situ of unspecified breast: Secondary | ICD-10-CM

## 2011-07-15 ENCOUNTER — Other Ambulatory Visit: Payer: Self-pay | Admitting: *Deleted

## 2011-07-15 DIAGNOSIS — C50919 Malignant neoplasm of unspecified site of unspecified female breast: Secondary | ICD-10-CM

## 2011-07-15 DIAGNOSIS — C44599 Other specified malignant neoplasm of skin of other part of trunk: Secondary | ICD-10-CM

## 2011-07-15 DIAGNOSIS — C50219 Malignant neoplasm of upper-inner quadrant of unspecified female breast: Secondary | ICD-10-CM

## 2011-07-15 MED ORDER — ANASTROZOLE 1 MG PO TABS: 1.0000 mg | ORAL_TABLET | Freq: Every day | ORAL | Status: DC

## 2011-07-23 ENCOUNTER — Telehealth: Payer: Self-pay | Admitting: *Deleted

## 2011-07-23 NOTE — Telephone Encounter (Signed)
Pt called stating that she talked it over with her family and has decided not to have the genetics done.  I have cancelled the appt.

## 2011-07-28 ENCOUNTER — Other Ambulatory Visit: Payer: Self-pay | Admitting: Family Medicine

## 2011-10-14 ENCOUNTER — Ambulatory Visit
Admission: RE | Admit: 2011-10-14 | Discharge: 2011-10-14 | Disposition: A | Discharge status: Home / Self Care | Payer: BC Managed Care – PPO | Source: Ambulatory Visit | Attending: Oncology | Admitting: Oncology

## 2011-10-14 DIAGNOSIS — D0590 Unspecified type of carcinoma in situ of unspecified breast: Secondary | ICD-10-CM

## 2011-11-18 ENCOUNTER — Telehealth: Payer: Self-pay | Admitting: *Deleted

## 2011-11-18 NOTE — Telephone Encounter (Signed)
patient called in and requested to change her appointment for 12-10-2011 moving it to 12-09-2011 at 4:00pm confirmed over the phone the new date and time

## 2011-12-09 ENCOUNTER — Other Ambulatory Visit (HOSPITAL_BASED_OUTPATIENT_CLINIC_OR_DEPARTMENT_OTHER): Payer: BC Managed Care – PPO | Admitting: Lab

## 2011-12-09 ENCOUNTER — Other Ambulatory Visit: Payer: Self-pay | Admitting: Oncology

## 2011-12-09 ENCOUNTER — Other Ambulatory Visit: Payer: Self-pay | Admitting: *Deleted

## 2011-12-09 DIAGNOSIS — D0590 Unspecified type of carcinoma in situ of unspecified breast: Secondary | ICD-10-CM

## 2011-12-09 DIAGNOSIS — E7800 Pure hypercholesterolemia, unspecified: Secondary | ICD-10-CM

## 2011-12-09 DIAGNOSIS — M949 Disorder of cartilage, unspecified: Secondary | ICD-10-CM

## 2011-12-09 DIAGNOSIS — E559 Vitamin D deficiency, unspecified: Secondary | ICD-10-CM

## 2011-12-09 DIAGNOSIS — M899 Disorder of bone, unspecified: Secondary | ICD-10-CM

## 2011-12-09 LAB — COMPREHENSIVE METABOLIC PANEL
AST: 26 U/L (ref 0–37)
Albumin: 3.8 g/dL (ref 3.5–5.2)
BUN: 11 mg/dL (ref 6–23)
CO2: 28 mEq/L (ref 19–32)
Calcium: 8.7 mg/dL (ref 8.4–10.5)
Chloride: 106 mEq/L (ref 96–112)
Creatinine, Ser: 0.86 mg/dL (ref 0.50–1.10)
Potassium: 3.7 mEq/L (ref 3.5–5.3)

## 2011-12-09 LAB — CBC WITH DIFFERENTIAL/PLATELET
BASO%: 0.5 % (ref 0.0–2.0)
Basophils Absolute: 0 10*3/uL (ref 0.0–0.1)
Eosinophils Absolute: 0 10*3/uL (ref 0.0–0.5)
HCT: 37.7 % (ref 34.8–46.6)
HGB: 12.9 g/dL (ref 11.6–15.9)
LYMPH%: 27 % (ref 14.0–49.7)
MCHC: 34.1 g/dL (ref 31.5–36.0)
MONO#: 0.4 10*3/uL (ref 0.1–0.9)
NEUT#: 3.7 10*3/uL (ref 1.5–6.5)
NEUT%: 64.9 % (ref 38.4–76.8)
Platelets: 197 10*3/uL (ref 145–400)
WBC: 5.7 10*3/uL (ref 3.9–10.3)
lymph#: 1.5 10*3/uL (ref 0.9–3.3)

## 2011-12-10 ENCOUNTER — Other Ambulatory Visit: Payer: BC Managed Care – PPO | Admitting: Lab

## 2011-12-10 LAB — CANCER ANTIGEN 27.29: CA 27.29: 13 U/mL (ref 0–39)

## 2011-12-17 ENCOUNTER — Ambulatory Visit (HOSPITAL_BASED_OUTPATIENT_CLINIC_OR_DEPARTMENT_OTHER): Payer: BC Managed Care – PPO | Admitting: Oncology

## 2011-12-17 ENCOUNTER — Telehealth: Payer: Self-pay | Admitting: Oncology

## 2011-12-17 VITALS — BP 119/76 | HR 83 | Temp 98.4°F | Ht 62.3 in | Wt 139.5 lb

## 2011-12-17 DIAGNOSIS — D0590 Unspecified type of carcinoma in situ of unspecified breast: Secondary | ICD-10-CM

## 2011-12-17 DIAGNOSIS — C50219 Malignant neoplasm of upper-inner quadrant of unspecified female breast: Secondary | ICD-10-CM

## 2011-12-17 DIAGNOSIS — E559 Vitamin D deficiency, unspecified: Secondary | ICD-10-CM

## 2011-12-17 NOTE — Progress Notes (Signed)
Hematology and Oncology Follow Up Visit  Zyla Dascenzo [id number] 11/26/1960 51 y.o. 12/17/2011 6:33 PM   Principle Diagnosis: 51 yo woman with hx of er+ breast cancer N-, s/p lumpectomy 2008, following radiation on arimidex.   Interim History:  No current complaints, feels well..  She is working full-time. She is doing well. She has no complaints. She denies headaches blurred vision shortness of breath or cough. Last mammogram March 2013 was within normal limits. She is tolerating Arimidex well, she denies hot flashes or joint pains.  Medications: I have reviewed the patient's current medications.  Allergies: No Known Allergies  Past Medical History, Surgical history, Social history, and Family History were reviewed and updated.  Review of Systems: Constitutional:  Negative for fever, chills, night sweats, anorexia, weight loss, pain. Cardiovascular: no chest pain or dyspnea on exertion Respiratory: no cough, shortness of breath, or wheezing Neurological: no TIA or stroke symptoms Dermatological: negative ENT: negative Skin Gastrointestinal: no abdominal pain, change in bowel habits, or black or bloody stools Genito-Urinary: no dysuria, trouble voiding, or hematuria Hematological and Lymphatic: negative Breast: negative for breast lumps Musculoskeletal: negative Remaining ROS negative.  Physical Exam: Blood pressure 119/76, pulse 83, temperature 98.4 F (36.9 C), temperature source Oral, height 5' 2.3" (1.582 m), weight 139 lb 8 oz (63.277 kg). ECOG: 0 General appearance: alert, cooperative and appears stated age Head: Normocephalic, without obvious abnormality, atraumatic Neck: no adenopathy, no carotid bruit, no JVD, supple, symmetrical, trachea midline and thyroid not enlarged, symmetric, no tenderness/mass/nodules Lymph nodes: Cervical, supraclavicular, and axillary nodes normal. Cardiac : nl Pulmonary:nl Breasts:s/p bilateral implants, no masses, both axilla  negative. Abdomen:nl Extremities nl Neuro:nl  Lab Results: Lab Results  Component Value Date   WBC 5.7 12/09/2011   HGB 12.9 12/09/2011   HCT 37.7 12/09/2011   MCV 86.4 12/09/2011   PLT 197 12/09/2011     Chemistry      Component Value Date/Time   NA 141 12/09/2011 1549   K 3.7 12/09/2011 1549   CL 106 12/09/2011 1549   CO2 28 12/09/2011 1549   BUN 11 12/09/2011 1549   CREATININE 0.86 12/09/2011 1549      Component Value Date/Time   CALCIUM 8.7 12/09/2011 1549   ALKPHOS 65 12/09/2011 1549   AST 26 12/09/2011 1549   ALT 18 12/09/2011 1549   BILITOT 0.6 12/09/2011 1549       Radiological Studies: chest X-ray n/a Mammogram  Bone density n/a  Impression and Plan: 51 year old woman 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 We had a discussion today about continuing Arimidex. She is without any symptoms or side effects on this drug. She she had a bone density test in March 8 of 2012 which showed a slight degree of decrease in her bone density both in spine and hip. I have encouraged her to take additional vitamin D. Her vitamin D level is in a low-normal range. We will see her in a years time and repeat a mammogram as well as a bone density test. I once again discussed the implications of taking Arimidex for an additional 5 years. At the present time there is no data for this but certainly or could speculate that there would be additional protective effects of taking Arimidex for local additional 5 years.  More than 50% of the visit was spent in patient-related counselling   Pierce Crane, MD 5/31/20136:33 PM

## 2011-12-17 NOTE — Telephone Encounter (Signed)
gve the pt her may,june 2014 appt calendar along with the mammo/bone density appt for march at [facility]

## 2012-03-29 ENCOUNTER — Other Ambulatory Visit: Payer: Self-pay | Admitting: *Deleted

## 2012-03-29 DIAGNOSIS — E559 Vitamin D deficiency, unspecified: Secondary | ICD-10-CM

## 2012-03-29 DIAGNOSIS — D0590 Unspecified type of carcinoma in situ of unspecified breast: Secondary | ICD-10-CM

## 2012-03-29 MED ORDER — SIMVASTATIN 40 MG PO TABS: 40.0000 mg | ORAL_TABLET | Freq: Every day | ORAL | Status: DC

## 2012-07-30 ENCOUNTER — Other Ambulatory Visit: Payer: Self-pay | Admitting: Oncology

## 2012-10-07 ENCOUNTER — Encounter: Payer: Self-pay | Admitting: Oncology

## 2012-10-07 ENCOUNTER — Telehealth: Payer: Self-pay | Admitting: Oncology

## 2012-10-16 ENCOUNTER — Ambulatory Visit
Admission: RE | Admit: 2012-10-16 | Discharge: 2012-10-16 | Disposition: A | Discharge status: Home / Self Care | Payer: BC Managed Care – PPO | Source: Ambulatory Visit | Attending: Oncology | Admitting: Oncology

## 2012-10-16 DIAGNOSIS — E559 Vitamin D deficiency, unspecified: Secondary | ICD-10-CM

## 2012-10-16 DIAGNOSIS — D0590 Unspecified type of carcinoma in situ of unspecified breast: Secondary | ICD-10-CM

## 2012-11-28 ENCOUNTER — Encounter: Payer: Self-pay | Admitting: Gastroenterology

## 2012-12-12 ENCOUNTER — Other Ambulatory Visit (HOSPITAL_BASED_OUTPATIENT_CLINIC_OR_DEPARTMENT_OTHER): Payer: BC Managed Care – PPO

## 2012-12-12 ENCOUNTER — Other Ambulatory Visit: Payer: Self-pay | Admitting: Emergency Medicine

## 2012-12-12 DIAGNOSIS — D0590 Unspecified type of carcinoma in situ of unspecified breast: Secondary | ICD-10-CM

## 2012-12-12 DIAGNOSIS — Z1231 Encounter for screening mammogram for malignant neoplasm of breast: Secondary | ICD-10-CM

## 2012-12-12 DIAGNOSIS — C50219 Malignant neoplasm of upper-inner quadrant of unspecified female breast: Secondary | ICD-10-CM

## 2012-12-12 DIAGNOSIS — E559 Vitamin D deficiency, unspecified: Secondary | ICD-10-CM

## 2012-12-12 LAB — COMPREHENSIVE METABOLIC PANEL (CC13)
ALT: 16 U/L (ref 0–55)
AST: 22 U/L (ref 5–34)
Albumin: 3.7 g/dL (ref 3.5–5.0)
Alkaline Phosphatase: 77 U/L (ref 40–150)
BUN: 14.4 mg/dL (ref 7.0–26.0)
Calcium: 9.2 mg/dL (ref 8.4–10.4)
Chloride: 106 mEq/L (ref 98–107)
Potassium: 4 mEq/L (ref 3.5–5.1)
Sodium: 144 mEq/L (ref 136–145)

## 2012-12-12 LAB — CBC WITH DIFFERENTIAL/PLATELET
BASO%: 0.6 % (ref 0.0–2.0)
Basophils Absolute: 0 10*3/uL (ref 0.0–0.1)
EOS%: 0.4 % (ref 0.0–7.0)
MCH: 29.6 pg (ref 25.1–34.0)
MCHC: 34.3 g/dL (ref 31.5–36.0)
MCV: 86.1 fL (ref 79.5–101.0)
MONO%: 5.3 % (ref 0.0–14.0)
RBC: 4.8 10*6/uL (ref 3.70–5.45)
RDW: 13.3 % (ref 11.2–14.5)
lymph#: 1.9 10*3/uL (ref 0.9–3.3)

## 2012-12-19 ENCOUNTER — Ambulatory Visit: Payer: BC Managed Care – PPO | Admitting: Oncology

## 2012-12-19 ENCOUNTER — Telehealth: Payer: Self-pay | Admitting: Oncology

## 2012-12-19 ENCOUNTER — Ambulatory Visit (HOSPITAL_BASED_OUTPATIENT_CLINIC_OR_DEPARTMENT_OTHER): Payer: BC Managed Care – PPO | Admitting: Oncology

## 2012-12-19 VITALS — BP 120/71 | HR 69 | Temp 98.5°F | Resp 20 | Ht 62.3 in | Wt 139.0 lb

## 2012-12-19 DIAGNOSIS — C50912 Malignant neoplasm of unspecified site of left female breast: Secondary | ICD-10-CM

## 2012-12-19 DIAGNOSIS — Z853 Personal history of malignant neoplasm of breast: Secondary | ICD-10-CM

## 2012-12-19 DIAGNOSIS — E559 Vitamin D deficiency, unspecified: Secondary | ICD-10-CM

## 2012-12-19 DIAGNOSIS — M949 Disorder of cartilage, unspecified: Secondary | ICD-10-CM

## 2012-12-19 MED ORDER — ANASTROZOLE 1 MG PO TABS: ORAL_TABLET | ORAL | Status: DC

## 2012-12-19 MED ORDER — ALENDRONATE SODIUM 35 MG PO TABS: 35.0000 mg | ORAL_TABLET | ORAL | Status: DC

## 2012-12-19 MED ORDER — VITAMIN D 1000 UNITS PO TABS: 1000.0000 [IU] | ORAL_TABLET | Freq: Every day | ORAL | Status: DC

## 2012-12-19 NOTE — Patient Instructions (Addendum)
#1 you're doing well there's no evidence of recurrent disease.  #2 we discussed stopping your adjuvant antiestrogen therapy with Arimidex. We discussed the data. You have completed 6 years of therapy.  #3 We discussed your bone health including your bone density scan performed in March. There is some thinning of the bone this may be due to your treatments with the Arimidex. We therefore discussed stopping the Arimidex. We discussed starting you on Fosamax 35 mg weekly. We discussed how to take this. We also discussed your bone health and taking vitamin D as well as calcium. Prescriptions were sent to your pharmacy.  #4 I will plan on seeing you back in one years time or sooner if need arises.  Alendronate tablets What is this medicine? ALENDRONATE (a LEN droe nate) slows calcium loss from bones. It helps to make normal healthy bone and to slow bone loss in people with Paget's disease and osteoporosis. It may be used in others at risk for bone loss. This medicine may be used for other purposes; ask your health care provider or pharmacist if you have questions. What should I tell my health care provider before I take this medicine? They need to know if you have any of these conditions: -dental disease -esophagus, stomach, or intestine problems, like acid reflux or GERD -kidney disease -low blood calcium -low vitamin D -problems sitting or standing 30 minutes -trouble swallowing -an unusual or allergic reaction to alendronate, other medicines, foods, dyes, or preservatives -pregnant or trying to get pregnant -breast-feeding How should I use this medicine? You must take this medicine exactly as directed or you will lower the amount of the medicine you absorb into your body or you may cause yourself harm. Take this medicine by mouth first thing in the morning, after you are up for the day. Do not eat or drink anything before you take your medicine. Swallow the tablet with a full glass (6 to 8  fluid ounces) of plain water. Do not take this medicine with any other drink. Do not chew or crush the tablet. After taking this medicine, do not eat breakfast, drink, or take any medicines or vitamins for at least 30 minutes. Sit or stand up for at least 30 minutes after you take this medicine; do not lie down. Do not take your medicine more often than directed. Talk to your pediatrician regarding the use of this medicine in children. Special care may be needed. Overdosage: If you think you have taken too much of this medicine contact a poison control center or emergency room at once. NOTE: This medicine is only for you. Do not share this medicine with others. What if I miss a dose? If you miss a dose, do not take it later in the day. Continue your normal schedule starting the next morning. Do not take double or extra doses. What may interact with this medicine? -aluminum hydroxide -antacids -aspirin -calcium supplements -drugs for inflammation like ibuprofen, naproxen, and others -iron supplements -magnesium supplements -vitamins with minerals This list may not describe all possible interactions. Give your health care provider a list of all the medicines, herbs, non-prescription drugs, or dietary supplements you use. Also tell them if you smoke, drink alcohol, or use illegal drugs. Some items may interact with your medicine. What should I watch for while using this medicine? Visit your doctor or health care professional for regular checks ups. It may be some time before you see benefit from this medicine. Do not stop taking your medicine  except on your doctor's advice. Your doctor or health care professional may order blood tests and other tests to see how you are doing. You should make sure you get enough calcium and vitamin D while you are taking this medicine, unless your doctor tells you not to. Discuss the foods you eat and the vitamins you take with your health care professional. Some  people who take this medicine have severe bone, joint, and/or muscle pain. This medicine may also increase your risk for a broken thigh bone. Tell your doctor right away if you have pain in your upper leg or groin. Tell your doctor if you have any pain that does not go away or that gets worse. This medicine can make you more sensitive to the sun. If you get a rash while taking this medicine, sunlight may cause the rash to get worse. Keep out of the sun. If you cannot avoid being in the sun, wear protective clothing and use sunscreen. Do not use sun lamps or tanning beds/booths. What side effects may I notice from receiving this medicine? Side effects that you should report to your doctor or health care professional as soon as possible: -allergic reactions like skin rash, itching or hives, swelling of the face, lips, or tongue -black or tarry stools -bone, muscle or joint pain -changes in vision -chest pain -heartburn or stomach pain -jaw pain, especially after dental work -pain or trouble when swallowing -redness, blistering, peeling or loosening of the skin, including inside the mouth Side effects that usually do not require medical attention (report to your doctor or health care professional if they continue or are bothersome): -changes in taste -diarrhea or constipation -eye pain or itching -headache -nausea or vomiting -stomach gas or fullness This list may not describe all possible side effects. Call your doctor for medical advice about side effects. You may report side effects to FDA at 1-800-FDA-1088. Where should I keep my medicine? Keep out of the reach of children. Store at room temperature of 15 and 30 degrees C (59 and 86 degrees F). Throw away any unused medicine after the expiration date. NOTE: This sheet is a summary. It may not cover all possible information. If you have questions about this medicine, talk to your doctor, pharmacist, or health care provider.  2013,  [employer]vier/Gold Standard. (01/01/2011 8:56:09 AM)    Breast Cancer Survivor Follow-Up Breast cancer begins when cells in the breast divide too rapidly. The extra cells form a lump (tumor). When the cancer is treated, the goal is to get rid of all cancer cells. However, sometimes a few cells survive. These cancer cells can then grow. They become recurrent cancer. This means the cancer comes back after treatment.  Most cases of recurrent breast cancer develop 3 to 5 years after treatment. However, sometimes it comes back just a few months after treatment. Other times, it does not come back until years later. If the cancer comes back in the same area as the first breast cancer, it is called a local recurrence. If the cancer comes back somewhere else in the body, it is called regional recurrence if the site is fairly near the breast or distant recurrence if it is far from the breast. Your caregiver may also use the term metastasize to indicate a cancer that has gone to another part of your body. Treatment is still possible after either kind of recurrence. The cancer can still be controlled.  CAUSES OF RECURRENT CANCER No one knows exactly why breast cancer  starts in the first place. Why the cancer comes back after treatment is also not clear. It is known that certain conditions, called risk factors, can make this more likely. They include:  Developing breast cancer for the first time before age 68.  Having breast cancer that involves the lymph nodes. These are small, round pieces of tissue found all over the body. Their job is to help fight infections.  Having a large tumor. Cancer is more apt to come back if the first tumor was bigger than 2 inches (5 cm).  Having certain types of breast cancer, such as:  Inflammatory breast cancer. This rare type grows rapidly and causes the breast to become red and swollen.  A high-grade tumor. The grade of a tumor indicates how fast it will grow and spread.  High-grade tumors grow more quickly than other types.  HER2 cancer. This refers to the tumor's genetic makeup. Tumors that have this type of gene are more likely to come back after treatment.  Having close tumor margins. This refers to the space between the tumor and normal, noncancerous cells. If the space is small, the tumor has a greater chance of coming back.  Having treatment involving a surgery to remove the tumor but not the entire breast (lumpectomy) and no radiation therapy. CARE AFTER BREAST CANCER Home Monitoring Women who have had breast cancer should continue to examine their breasts every month. The goal is to catch the cancer quickly if it comes back. Many women find it helpful to do so on the same day each month and to mark the calendar as a reminder. Let your caregiver know immediately if you have any signs of recurrent breast cancer. Symptoms will vary, depending on where the cancer recurs. The original type of treatment can also make a difference. Symptoms of local recurrence after a lumpectomy or a recurrence in the opposite breast may include:  A new lump or thickening in the breast.  A change in the way the skin looks on the breast (such as a rash, dimpling, or wrinkling).  Redness or swelling of the breast.  Changes in the nipple (such as being red, puckered, swollen, or leaking fluid). Symptoms of a recurrence after a breast removal surgery (mastectomy) may include:  A lump or thickening under the skin.  A thickening around the mastectomy scar. Symptoms of regional recurrence in the lymph nodes near the breast may include:  A lump under the arm or above the collarbone.  Swelling of the arm.  Pain in the arm, shoulder, or chest.  Numbness in the hand or arm. Symptoms of distant recurrence may include:  A cough that does not go away.  Trouble breathing or shortness of breath.  Pain in the bones or the chest. This is pain that lasts or does not respond to  rest and medicine.  Headaches.  Sudden vision problems.  Dizziness.  Nausea or vomiting.  Losing weight without trying to.  Persistent abdominal pain.  Changes in bowel movements or blood in the stool.  Yellowing of the skin or eyes (jaundice).  Blood in the urine or bloody vaginal discharge. Clinical Monitoring  It is helpful to keep a schedule of appointments for needed tests and exams. This includes physical exams, breast exams, exams of the lymph nodes, and general exams.  For the first 3 years after being treated for breast cancer, see your caregiver every 3 to 6 months.  For years 4 and 5 after breast cancer, see your caregiver  every 6 to 12 months.  After 5 years, see your caregiver at least once a year.  Regular breast X-rays (mammograms) should continue even if you had a mastectomy.  A mammogram should be done 1 year after the mammogram that first detected breast cancer.  A mammogram should be done every 6 to 12 months after that. Follow your caregiver's advice.  A pelvic exam done by your caregiver checks whether female organs are the normal size and shape. The exam is usually done every year. Ask your caregiver if that schedule is right for you.  Women taking tamoxifen should report any vaginal bleeding immediately to their caregiver. Tamoxifen is often given to women with a certain type of breast cancer. It has been shown to help prevent recurrence.  You will need to decide who your primary caregiver will be.  Most people continue to see their cancer specialist (oncologist) every 3 to 6 months for the first year after cancer treatment.  At some point, you may want to go back to seeing your family caregiver. You would no longer see your oncologist for regular checkups. Many women do this about 1 year after their first diagnosis of breast cancer.  You will still need to be seen every so often by your oncologist. Ask how often that should be. Coordinate this with  your family or primary caregiver.  Think about having genetic counseling. This would provide information on traits that can be passed or inherited from one generation to the next. In some cases, breast cancer runs in families. Tell your caregiver if you:  Are of Ashkenazi Jewish heritage.  Have any family member who has had ovarian cancer.  Have a mother, sister, or daughter who had breast cancer before age 22.  Have 2 or more close relatives who have had breast cancer. This means a mother, sister, daughter, aunt, or grandmother.  Had breast cancer in both breasts.  Have a female relative who has had breast cancer.  Some tests are not recommended for routine screening. Someone recovering from breast cancer does not need to have these tests if there are no problems. The tests have risks, such as radiation exposure, and can be costly. The risks of these tests are thought to be greater than the benefits:  Blood tests.  Chest X-rays.  Bone scans.  Liver ultrasound.  Computed tomography (CT scan).  Positron emission tomography (PET scan).  Magnetic resonance imaging (MRI scan). DIAGNOSIS OF RECURRENT CANCER Recurrent breast cancer may be suspected for various reasons. A mammogram may not look normal. You might feel a lump or have other symptoms. Your caregiver may find something unusual during an exam. To be sure, your caregiver will probably order some tests. The tests are needed because there are symptoms or hints of a problem. They could include:  Blood tests, including a test to check how well the liver is working. The liver is a common site for a distant cancer recurrence.  Imaging tests that create pictures of the inside of the body. These tests include:  Chest X-rays to show if the cancer has come back in the lungs.  CT scans to create detailed pictures of various areas of the body and help find a distant recurrence.  MRI scans to find anything unusual in the breast, chest,  or lymph nodes.  Breast ultrasound tests to examine the breasts.  Bone scans to create a picture of your whole skeleton and find cancer in bony areas.  PET scans to create  an image of the whole body. PET scans can be used together with CT scans to show more detail.  Biopsy. A small sample of tissue is taken and checked under a microscope. If cancer cells are found, they may be tested to see if they contain the HER2 gene or the hormones estrogen and progesterone. This will help your caregiver decide how to treat the recurrent cancer. TREATMENT  How recurrent breast cancer is treated depends on where the new cancer is found. The type of treatment that was used for the first breast cancer makes a difference, too. A combination of treatments may be used. Options include:  Surgery.  If the cancer comes back in the breast that was not treated before, you may need a lumpectomy or mastectomy.  If the cancer comes back in the breast that was treated before, you may need a mastectomy.  The lymph nodes under the arm may need to be removed.  Radiation therapy.  For a local recurrence, radiation may be used if it was not used during the first treatment.  For a distance recurrence, radiation is sometimes used.  Chemotherapy.  This may be used before surgery to treat recurrent breast cancer.  This may be used to treat recurrent cancer that cannot be treated with surgery.  This may be used to treat a distant recurrence.  Hormone therapy.  Women with the HER2 gene may be given hormone therapy to attack this gene. Document Released: 03/03/2011 Document Revised: 09/27/2011 Document Reviewed: 03/03/2011 [facility] Patient Information 2014 [place], [place].

## 2012-12-19 NOTE — Telephone Encounter (Signed)
, °

## 2013-01-12 NOTE — Progress Notes (Signed)
OFFICE PROGRESS NOTE  CC  Tara Nora, MD [address] [place] [address] Dr. Margaretmary Mercer Dr. Jerral Mercer  DIAGNOSIS: 52 year old female with history of T1 B. 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 Diagnosed 11/15/2006.  PRIOR THERAPY:  #1 patient presented at the age of 41 with a lump in the left breast in November 2007 in the 10:00 position. She had a biopsy performed that showed invasive ductal carcinoma ER positive PR positive HER-2/neu negative. Her original biopsy was performed in [place].  #2 patient subsequently sought a Mercer opinion and was seen by Dr. Lavell Mercer and MRI performed in March 2008 revealed a 8 mm solitary area of enhancement in the left breast at 10:00 with no other abnormal enhancement. She subsequently underwent a lumpectomy with sentinel lymph node dissection with Dr. Lavell Mercer on March 25. The final pathology revealed a 1.0 cm grade 1 invasive ductal carcinoma with out lymphovascular invasion. 2 lymph nodes were negative for metastatic disease. Patient was seen in the multidisciplinary breast clinic on 10/19/2006.  #3 patient was seen by Dr. Pierce Mercer and Dr. Margaretmary Mercer. It was recommended that patient undergo Oncotype testing. She had this done and her Oncotype test breast cancer recurrence score was 25% indicating a 16% risk of recurrence in the absence of chemotherapy. Patient elected to forego chemotherapy and proceeded to radiation. She had her radiation completed from Nov 22 2006 through 12/24/2006.  #4 patient underwent bilateral salpingo-oophorectomy in August 2008. Thereafter she was begun on antiestrogen therapy with a remedy axilla in September 2008. She remains on this currently. Patient will discontinue antiestrogen therapy after 6 years of therapy.  #5 patient has also had bone density scan performed which showed low  bone mass. She is recommended Fosamax 35 mg weekly along with vitamin D and calcium.   CURRENT THERAPY: Observation  INTERVAL HISTORY: [name] 52 y.o. female returns for followup visit today. This is the first time that I have seen her. She has been seeing Dr. Pierce Mercer in the past for reference please see his previously dictated notes. I have summarize her history as above. Clinically she seems to be doing well she has no fevers chills night sweats headaches shortness of breath chest pains palpitations no peripheral paresthesias no myalgias and arthralgias. We discussed the results of her bone density scan was performed back in March of this year. She does have a low bone mass. I have recommended she begin Fosamax 35 mg weekly. I instructed her on how to take it. We also discussed side effects of it. Remainder of the 10 point review of systems is negative.  MEDICAL HISTORY:No past medical history on file.  ALLERGIES:  has No Known Allergies.  MEDICATIONS:  Current Outpatient Prescriptions  Medication Sig Dispense Refill  . anastrozole (ARIMIDEX) 1 MG tablet TAKE 1 TABLET BY MOUTH EVERY DAY  90 tablet  12  . Calcium Carbonate-Vitamin D (CALCIUM 600+D) 600-400 MG-UNIT per tablet Take 1 tablet by mouth daily.        . Calcium-Vitamin D-Vitamin K (VIACTIV PO) Take 2 tablets by mouth daily.      . simvastatin (ZOCOR) 40 MG tablet Take 1 tablet (40 mg total) by mouth at bedtime. Pt. Takes every other night  90 tablet  2  . alendronate (FOSAMAX) 35 MG tablet Take 1 tablet (35 mg total) by mouth every 7 (  seven) days. Take with a full glass of water on an empty stomach.  12 tablet  3  . cholecalciferol (VITAMIN D) 1000 UNITS tablet Take 1 tablet (1,000 Units total) by mouth daily.  90 tablet  6  . fish oil-omega-3 fatty acids 1000 MG capsule Take 2 g by mouth daily.         No current facility-administered medications for this visit.    SURGICAL HISTORY:  Past Surgical History   Procedure Laterality Date  . Breast surgery  LEFT    LUMPECTOMY  . Oophrectomy      BI LATERERAL FOR CANCER PREVENTON  . Pilonidal cyst excision      REVIEW OF SYSTEMS:  Pertinent items are noted in HPI.   HEALTH MAINTENANCE:  PHYSICAL EXAMINATION: Blood pressure 120/71, pulse 69, temperature 98.5 F (36.9 C), temperature source Oral, resp. rate 20, height 5' 2.3" (1.582 m), weight 139 lb (63.05 kg). Body mass index is 25.19 kg/(m^2). ECOG PERFORMANCE STATUS: 0 - Asymptomatic   General appearance: alert, cooperative and appears stated age Resp: clear to auscultation bilaterally Cardio: regular rate and rhythm GI: soft, non-tender; bowel sounds normal; no masses,  no organomegaly Extremities: extremities normal, atraumatic, no cyanosis or edema Neurologic: Grossly normal Breast examination: Right breast no masses nipple discharge, left breast is no masses or nipple discharge well-healed surgical scar.  LABORATORY DATA: Lab Results  Component Value Date   WBC 7.0 12/12/2012   HGB 14.2 12/12/2012   HCT 41.3 12/12/2012   MCV 86.1 12/12/2012   PLT 234 12/12/2012      Chemistry      Component Value Date/Time   NA 144 12/12/2012 1357   NA 141 12/09/2011 1549   K 4.0 12/12/2012 1357   K 3.7 12/09/2011 1549   CL 106 12/12/2012 1357   CL 106 12/09/2011 1549   CO2 28 12/12/2012 1357   CO2 28 12/09/2011 1549   BUN 14.4 12/12/2012 1357   BUN 11 12/09/2011 1549   CREATININE 1.0 12/12/2012 1357   CREATININE 0.86 12/09/2011 1549      Component Value Date/Time   CALCIUM 9.2 12/12/2012 1357   CALCIUM 8.7 12/09/2011 1549   ALKPHOS 77 12/12/2012 1357   ALKPHOS 65 12/09/2011 1549   AST 22 12/12/2012 1357   AST 26 12/09/2011 1549   ALT 16 12/12/2012 1357   ALT 18 12/09/2011 1549   BILITOT 0.60 12/12/2012 1357   BILITOT 0.6 12/09/2011 1549       RADIOGRAPHIC STUDIES:  No results found.  ASSESSMENT: 52 year old female with  #1 history of stage I (T1 N0) invasive ductal carcinoma of the left  breast status post lumpectomy in April 2008 followed by Oncotype DX testing that revealed a recurrence score of 25 with a risk of distant recurrence at 16%. Patient declined chemotherapy.  #2 she proceeded with adjuvant radiation therapy to the left breast given by Dr. Margaretmary Mercer. She completed this in 01/03/2007.  #3 thereafter patient had bilateral salpingo-oophorectomy performed which rendered her postmenopausal. She then went on to receive Arimidex 1 mg daily. She has now completed 6 years of this. I have recommended that she discontinue the Arimidex at this time.  #4 bone density scan performed in March revealed low bone mass. I have recommended she begin Fosamax.   PLAN:   #1 patient will discontinue Arimidex.  #2 begin Fosamax 35 mg q. weekly. Risks benefits and side effects of treatment were discussed with the patient including osteonecrosis of the jaw. She  is also to take calcium and vitamin D.  #3 I will see her back in one years time for followup.   All questions were answered. The patient knows to call the clinic with any problems, questions or concerns. We can certainly see the patient much sooner if necessary.  I spent 30 minutes counseling the patient face to face. The total time spent in the appointment was 30 minutes.    Tara Second, MD Medical/Oncology [facility] [phone] (beeper) [phone] (Office)  01/12/2013, 9:53 AM

## 2013-03-29 ENCOUNTER — Telehealth: Payer: Self-pay | Admitting: *Deleted

## 2013-03-29 ENCOUNTER — Other Ambulatory Visit: Payer: Self-pay | Admitting: Adult Health

## 2013-03-29 NOTE — Telephone Encounter (Signed)
VERBAL ORDER AND READ BACK TO DR.KHAN- STOP VITAMIN D. PT. NEEDS TO HAVE HER VITAMIN D LEVEL CHECKED. PHARMACY SHOULD NOTIFY PT. OF THIS MEDICATION ERROR. SPOKE WITH MEGAN AT THE [facility]. SHE HAS ALREADY NOTIFIED PT. OF THE MEDICATION ERROR. CALLED PT. CONCERNING AN APPOINTMENT FOR TOMORROW TO HAVE HER VITAMIN D LEVEL DRAWN. SHE WILL CONTACT A SCHEDULER TODAY OR EARLY TOMORROW MORNING.

## 2013-03-29 NOTE — Telephone Encounter (Signed)
ON 12/19/12 AN ORDER FOR VITAMIN D 1,000UNITS DAILY #90 WAS SENT TO [facility] IN [place]. ON 12/30/12 PT. RECEIVED VITAMIN D 50,000UNITS WITH THE DIRECTIONS TO TAKE DAILY IN ERROR. PT. HAS BEEN TAKING THIS MEDICATION FOR ALMOST 90 DAYS.  SHOULD PT. HAVE HER VITAMIN D LEVEL CHECKED BEFORE THE 90 DAY SUPPLY OF THE VITAMIN D 1,000UNITS DAILY IS DISPENSED? THIS NOTE TO DR.KHAN'S NURSE, MEREDITH WALTON,RN.

## 2013-03-30 ENCOUNTER — Ambulatory Visit (HOSPITAL_BASED_OUTPATIENT_CLINIC_OR_DEPARTMENT_OTHER): Payer: BC Managed Care – PPO | Admitting: Lab

## 2013-03-30 ENCOUNTER — Telehealth: Payer: Self-pay | Admitting: Oncology

## 2013-03-30 DIAGNOSIS — C50912 Malignant neoplasm of unspecified site of left female breast: Secondary | ICD-10-CM

## 2013-03-30 DIAGNOSIS — C50919 Malignant neoplasm of unspecified site of unspecified female breast: Secondary | ICD-10-CM

## 2013-03-30 DIAGNOSIS — E559 Vitamin D deficiency, unspecified: Secondary | ICD-10-CM

## 2013-03-30 LAB — COMPREHENSIVE METABOLIC PANEL (CC13)
ALT: 21 U/L (ref 0–55)
CO2: 28 mEq/L (ref 22–29)
Chloride: 107 mEq/L (ref 98–109)
Sodium: 143 mEq/L (ref 136–145)
Total Bilirubin: 0.48 mg/dL (ref 0.20–1.20)
Total Protein: 7 g/dL (ref 6.4–8.3)

## 2013-03-30 LAB — CBC WITH DIFFERENTIAL/PLATELET
Eosinophils Absolute: 0.1 10*3/uL (ref 0.0–0.5)
MONO#: 0.4 10*3/uL (ref 0.1–0.9)
NEUT#: 3.7 10*3/uL (ref 1.5–6.5)
RBC: 4.46 10*6/uL (ref 3.70–5.45)
RDW: 13.7 % (ref 11.2–14.5)
WBC: 6.1 10*3/uL (ref 3.9–10.3)
lymph#: 1.8 10*3/uL (ref 0.9–3.3)

## 2013-03-30 NOTE — Telephone Encounter (Signed)
, °

## 2013-03-31 LAB — VITAMIN D 25 HYDROXY (VIT D DEFICIENCY, FRACTURES): Vit D, 25-Hydroxy: >120 ng/mL — ABNORMAL HIGH (ref 30–89)

## 2013-06-06 ENCOUNTER — Other Ambulatory Visit: Payer: Self-pay | Admitting: Family Medicine

## 2013-07-06 ENCOUNTER — Other Ambulatory Visit: Payer: Self-pay | Admitting: Family Medicine

## 2013-07-30 ENCOUNTER — Other Ambulatory Visit: Payer: Self-pay | Admitting: *Deleted

## 2013-07-30 MED ORDER — SIMVASTATIN 40 MG PO TABS: ORAL_TABLET | ORAL | Status: DC

## 2013-07-31 ENCOUNTER — Other Ambulatory Visit: Payer: Self-pay | Admitting: *Deleted

## 2013-09-07 ENCOUNTER — Telehealth: Payer: Self-pay | Admitting: Family Medicine

## 2013-09-07 ENCOUNTER — Other Ambulatory Visit (INDEPENDENT_AMBULATORY_CARE_PROVIDER_SITE_OTHER): Payer: BC Managed Care – PPO

## 2013-09-07 DIAGNOSIS — T452X1A Poisoning by vitamins, accidental (unintentional), initial encounter: Secondary | ICD-10-CM

## 2013-09-07 DIAGNOSIS — E7800 Pure hypercholesterolemia, unspecified: Secondary | ICD-10-CM

## 2013-09-07 NOTE — Addendum Note (Signed)
Addended by: Marchia Bond on: 09/07/2013 12:13 PM   Modules accepted: Orders

## 2013-09-07 NOTE — Telephone Encounter (Signed)
Message copied by Jinny Sanders on Fri Sep 07, 2013  8:43 AM ------      Message from: Ellamae Sia      Created: Fri Aug 31, 2013  4:57 PM      Regarding: Lab orders for Friday, 2.20.15       Patient is scheduled for CPX labs, please order future labs, Thanks , Terri       ------

## 2013-09-14 ENCOUNTER — Encounter: Payer: Self-pay | Admitting: Family Medicine

## 2013-09-14 ENCOUNTER — Other Ambulatory Visit (HOSPITAL_COMMUNITY)
Admission: RE | Admit: 2013-09-14 | Discharge: 2013-09-14 | Disposition: A | Discharge status: Home / Self Care | Payer: BC Managed Care – PPO | Source: Ambulatory Visit | Attending: Family Medicine | Admitting: Family Medicine

## 2013-09-14 ENCOUNTER — Ambulatory Visit (INDEPENDENT_AMBULATORY_CARE_PROVIDER_SITE_OTHER): Payer: BC Managed Care – PPO | Admitting: Family Medicine

## 2013-09-14 VITALS — BP 110/62 | HR 67 | Temp 98.4°F | Ht 62.25 in | Wt 141.5 lb

## 2013-09-14 DIAGNOSIS — M8580 Other specified disorders of bone density and structure, unspecified site: Secondary | ICD-10-CM | POA: Insufficient documentation

## 2013-09-14 DIAGNOSIS — Z01419 Encounter for gynecological examination (general) (routine) without abnormal findings: Secondary | ICD-10-CM | POA: Insufficient documentation

## 2013-09-14 DIAGNOSIS — Z124 Encounter for screening for malignant neoplasm of cervix: Secondary | ICD-10-CM

## 2013-09-14 DIAGNOSIS — Z0000 Encounter for general adult medical examination without abnormal findings: Secondary | ICD-10-CM

## 2013-09-14 DIAGNOSIS — T452X1A Poisoning by vitamins, accidental (unintentional), initial encounter: Secondary | ICD-10-CM

## 2013-09-14 DIAGNOSIS — E7800 Pure hypercholesterolemia, unspecified: Secondary | ICD-10-CM

## 2013-09-14 DIAGNOSIS — Z1151 Encounter for screening for human papillomavirus (HPV): Secondary | ICD-10-CM | POA: Insufficient documentation

## 2013-09-14 NOTE — Assessment & Plan Note (Signed)
Resolved

## 2013-09-14 NOTE — Assessment & Plan Note (Signed)
Well controlled. Continue current medication.  

## 2013-09-14 NOTE — Addendum Note (Signed)
Addended by: Carter Kitten on: 09/14/2013 02:39 PM   Modules accepted: Orders

## 2013-09-14 NOTE — Patient Instructions (Addendum)
Ca  600 mg and vit D 400 IU twice daily. Continue healthy eating and regular exercise. Schedule mammogram on your own.

## 2013-09-14 NOTE — Progress Notes (Signed)
HPI  The patient is here for annual wellness exam and preventative care.   Feeling well overall.   Elevated Cholesterol:On fish oil and simvastatin 40 mg daily.  Father with heart disease at early age  LDL 106  HDL 64  trig 80 Using medications without problems:  None Muscle aches: None Other complaints:   Left breast carcinoma in situ...s/p lumpectomy.  Dr Humphrey Rolls...sees her yearly Dx in 2008.  On arimedex.  Has history of BSO due to risk of ovarian cancer. Still has uterus and ovaries.    Exercising daily on treadmill.  Review of Systems  Constitutional: Negative for fever, fatigue and unexpected weight change.  HENT: Negative for ear pain, congestion, sore throat, sneezing, trouble swallowing and sinus pressure.  Eyes: Negative for pain and itching.  Respiratory: Negative for cough, shortness of breath and wheezing.  Cardiovascular: Negative for chest pain, palpitations and leg swelling.  Gastrointestinal: Negative for nausea, abdominal pain, diarrhea, constipation and blood in stool.  Genitourinary: Negative for dysuria, hematuria, vaginal discharge, difficulty urinating and menstrual problem.  Skin: Negative for rash.  Neurological: Negative for syncope, weakness, light-headedness, numbness and headaches.  Psychiatric/Behavioral: Negative for confusion and dysphoric mood. The patient is not nervous/anxious.  Objective:   Physical Exam  Constitutional: Vital signs are normal. She appears well-developed and well-nourished. She is cooperative. Non-toxic appearance. She does not appear ill. No distress.  HENT:  Head: Normocephalic.  Right Ear: Hearing, tympanic membrane, external ear and ear canal normal.  Left Ear: Hearing, tympanic membrane, external ear and ear canal normal.  Nose: Nose normal.  Eyes: Conjunctivae, EOM and lids are normal. Pupils are equal, round, and reactive to light. No foreign bodies found.  Neck: Trachea normal and normal range of motion. Neck supple.  Carotid bruit is not present. No mass and no thyromegaly present.  Cardiovascular: Normal rate, regular rhythm, S1 normal, S2 normal, normal heart sounds and intact distal pulses. Exam reveals no gallop.  No murmur heard.  Pulmonary/Chest: Effort normal and breath sounds normal. No respiratory distress. She has no wheezes. She has no rhonchi. She has no rales.  Abdominal: Soft. Normal appearance and bowel sounds are normal. She exhibits no distension, no fluid wave, no abdominal bruit and no mass. There is no hepatosplenomegaly. There is no tenderness. There is no rebound, no guarding and no CVA tenderness. No hernia.  Genitourinary: Vagina normal. Pelvic exam was performed with patient prone. No labial fusion. There is no rash, tenderness, lesion or injury on the right labia. There is no rash, tenderness, lesion or injury on the left labia. No ovaries  Uterus is not enlarged and not tender.  Breast exam per Humphrey Rolls Pap performed  Lymphadenopathy:  She has no cervical adenopathy.  She has no axillary adenopathy.  Neurological: She is alert. She has normal strength. No cranial nerve deficit or sensory deficit.  Skin: Skin is warm, dry and intact. No rash noted.  Psychiatric: Her speech is normal and behavior is normal. Judgment normal. Her mood appears not anxious. Cognition and memory are normal. She does not exhibit a depressed mood.  Assessment & Plan:   Complete Physical Exam; The patient's preventative maintenance and recommended screening tests for an annual wellness exam were reviewed in full today.  Brought up to date unless services declined.  Counselled on the importance of diet, exercise, and its role in overall health and mortality.  The patient's FH and SH was reviewed, including their home life, tobacco status, and drug and alcohol status.  Has had 3 paps in a row 2009, 10, 11.  Continue DVE given uterus still present.  S/P BSO PAPs every 2-3 years but pt overdue. Breast exam at  [facility]: 10/2012: osteopenia ( but high risk since on Arimidex, she is using fosamax to prevent deterioration), repeat in 2-5 years Vaccines: uptodate with tetanus.

## 2013-09-14 NOTE — Progress Notes (Signed)
Pre visit review using our clinic review tool, if applicable. No additional management support is needed unless otherwise documented below in the visit note. 

## 2013-09-17 ENCOUNTER — Other Ambulatory Visit: Payer: Self-pay | Admitting: Oncology

## 2013-09-17 DIAGNOSIS — Z9889 Other specified postprocedural states: Secondary | ICD-10-CM

## 2013-09-17 DIAGNOSIS — Z853 Personal history of malignant neoplasm of breast: Secondary | ICD-10-CM

## 2013-09-18 ENCOUNTER — Encounter: Payer: Self-pay | Admitting: *Deleted

## 2013-10-02 ENCOUNTER — Other Ambulatory Visit: Payer: Self-pay | Admitting: Family Medicine

## 2013-10-08 ENCOUNTER — Encounter: Payer: Self-pay | Admitting: *Deleted

## 2013-10-17 ENCOUNTER — Ambulatory Visit
Admission: RE | Admit: 2013-10-17 | Discharge: 2013-10-17 | Disposition: A | Discharge status: Home / Self Care | Payer: BC Managed Care – PPO | Source: Ambulatory Visit | Attending: Oncology | Admitting: Oncology

## 2013-10-17 DIAGNOSIS — Z9889 Other specified postprocedural states: Secondary | ICD-10-CM

## 2013-10-17 DIAGNOSIS — Z853 Personal history of malignant neoplasm of breast: Secondary | ICD-10-CM

## 2013-12-04 ENCOUNTER — Telehealth: Payer: Self-pay | Admitting: Oncology

## 2013-12-04 NOTE — Telephone Encounter (Signed)
, °

## 2013-12-17 ENCOUNTER — Other Ambulatory Visit: Payer: BC Managed Care – PPO

## 2013-12-24 ENCOUNTER — Ambulatory Visit: Payer: BC Managed Care – PPO | Admitting: Oncology

## 2013-12-25 ENCOUNTER — Other Ambulatory Visit: Payer: Self-pay | Admitting: Oncology

## 2014-01-28 ENCOUNTER — Other Ambulatory Visit: Payer: Self-pay

## 2014-01-28 DIAGNOSIS — D0590 Unspecified type of carcinoma in situ of unspecified breast: Secondary | ICD-10-CM

## 2014-01-29 ENCOUNTER — Encounter: Payer: Self-pay | Admitting: Oncology

## 2014-01-29 ENCOUNTER — Other Ambulatory Visit (HOSPITAL_BASED_OUTPATIENT_CLINIC_OR_DEPARTMENT_OTHER): Payer: BC Managed Care – PPO

## 2014-01-29 ENCOUNTER — Ambulatory Visit (HOSPITAL_BASED_OUTPATIENT_CLINIC_OR_DEPARTMENT_OTHER): Payer: BC Managed Care – PPO | Admitting: Oncology

## 2014-01-29 VITALS — BP 121/61 | HR 62 | Temp 98.8°F | Resp 18 | Ht 62.0 in | Wt 141.4 lb

## 2014-01-29 DIAGNOSIS — M949 Disorder of cartilage, unspecified: Secondary | ICD-10-CM

## 2014-01-29 DIAGNOSIS — Z853 Personal history of malignant neoplasm of breast: Secondary | ICD-10-CM

## 2014-01-29 DIAGNOSIS — D0590 Unspecified type of carcinoma in situ of unspecified breast: Secondary | ICD-10-CM

## 2014-01-29 DIAGNOSIS — M8580 Other specified disorders of bone density and structure, unspecified site: Secondary | ICD-10-CM

## 2014-01-29 DIAGNOSIS — M899 Disorder of bone, unspecified: Secondary | ICD-10-CM

## 2014-01-29 LAB — COMPREHENSIVE METABOLIC PANEL (CC13)
ALBUMIN: 3.8 g/dL (ref 3.5–5.0)
ALT: 19 U/L (ref 0–55)
ANION GAP: 7 meq/L (ref 3–11)
AST: 26 U/L (ref 5–34)
Alkaline Phosphatase: 66 U/L (ref 40–150)
BILIRUBIN TOTAL: 0.54 mg/dL (ref 0.20–1.20)
BUN: 12.5 mg/dL (ref 7.0–26.0)
CALCIUM: 9.5 mg/dL (ref 8.4–10.4)
CHLORIDE: 107 meq/L (ref 98–109)
CO2: 28 mEq/L (ref 22–29)
Creatinine: 0.8 mg/dL (ref 0.6–1.1)
GLUCOSE: 84 mg/dL (ref 70–140)
POTASSIUM: 4 meq/L (ref 3.5–5.1)
SODIUM: 143 meq/L (ref 136–145)
Total Protein: 7 g/dL (ref 6.4–8.3)

## 2014-01-29 LAB — CBC WITH DIFFERENTIAL/PLATELET
BASO%: 0.6 % (ref 0.0–2.0)
Basophils Absolute: 0 10*3/uL (ref 0.0–0.1)
EOS ABS: 0.1 10*3/uL (ref 0.0–0.5)
EOS%: 1.1 % (ref 0.0–7.0)
HCT: 40.1 % (ref 34.8–46.6)
HGB: 13.3 g/dL (ref 11.6–15.9)
LYMPH#: 1.7 10*3/uL (ref 0.9–3.3)
LYMPH%: 29.4 % (ref 14.0–49.7)
MCH: 28.6 pg (ref 25.1–34.0)
MCHC: 33.1 g/dL (ref 31.5–36.0)
MCV: 86.4 fL (ref 79.5–101.0)
MONO#: 0.4 10*3/uL (ref 0.1–0.9)
MONO%: 6.8 % (ref 0.0–14.0)
NEUT%: 62.1 % (ref 38.4–76.8)
NEUTROS ABS: 3.6 10*3/uL (ref 1.5–6.5)
Platelets: 213 10*3/uL (ref 145–400)
RBC: 4.64 10*6/uL (ref 3.70–5.45)
RDW: 13.5 % (ref 11.2–14.5)
WBC: 5.9 10*3/uL (ref 3.9–10.3)

## 2014-01-29 NOTE — Progress Notes (Signed)
OFFICE PROGRESS NOTE  CC  Tara Lofts, MD [facility] [place]. [place] [address] Dr. Tyler Pita Dr. Cala Bradford  DIAGNOSIS: 53 year old female with history of T1 B. 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 Diagnosed 11/15/2006.  PRIOR THERAPY:  #1 patient presented at the age of 24 with a lump in the left breast in November 2007 in the 10:00 position. She had a biopsy performed that showed invasive ductal carcinoma ER positive PR positive HER-2/neu negative. Her original biopsy was performed in [place].  #2 patient subsequently sought a second opinion and was seen by Dr. Florestine Avers and MRI performed in March 2008 revealed a 8 mm solitary area of enhancement in the left breast at 10:00 with no other abnormal enhancement. She subsequently underwent a lumpectomy with sentinel lymph node dissection with Dr. Florestine Avers on March 25. The final pathology revealed a 1.0 cm grade 1 invasive ductal carcinoma with out lymphovascular invasion. 2 lymph nodes were negative for metastatic disease. Patient was seen in the multidisciplinary breast clinic on 10/19/2006.  #3 patient was seen by Dr. Eston Esters and Dr. Tyler Pita. It was recommended that patient undergo Oncotype testing. She had this done and her Oncotype test breast cancer recurrence score was 25% indicating a 16% risk of recurrence in the absence of chemotherapy. Patient elected to forego chemotherapy and proceeded to radiation. She had her radiation completed from Nov 22 2006 through 12/24/2006.  #4 patient underwent bilateral salpingo-oophorectomy in August 2008. Thereafter she was begun on antiestrogen therapy with a remedy axilla in September 2008. This was discontinued after 6 years of therapy in June 2014.  #5 patient has also had bone density scan performed which showed low bone mass. She is recommended Fosamax 35 mg  weekly along with vitamin D and calcium.   CURRENT THERAPY: Observation  INTERVAL HISTORY: [name] 53 y.o. female returns for followup visit today. Clinically she seems to be doing well she has no fevers chills night sweats headaches shortness of breath chest pains palpitations no peripheral paresthesias no myalgias and arthralgias. No breast changes. She remains off Vitamin D since 03/2013 due to an elevated Vitamin D level.  Remainder of the 10 point review of systems is negative.  MEDICAL HISTORY: Past Medical History  Diagnosis Date  . Breast cancer     ALLERGIES:  has No Known Allergies.  MEDICATIONS:  Current Outpatient Prescriptions  Medication Sig Dispense Refill  . alendronate (FOSAMAX) 35 MG tablet TAKE 1 TABLET BY MOUTH EVERY 7 DAYS WITH A FULL GLASS OF WATER  12 tablet  3  . Calcium Carbonate-Vitamin D (CALCIUM 600+D) 600-400 MG-UNIT per tablet Take 1 tablet by mouth daily.        . simvastatin (ZOCOR) 40 MG tablet TAKE 1 TABLET BY MOUTH EVERY OTHER NIGHT  45 tablet  2  . Calcium-Vitamin D-Vitamin K (VIACTIV PO) Take 2 tablets by mouth daily.       No current facility-administered medications for this visit.    SURGICAL HISTORY:  Past Surgical History  Procedure Laterality Date  . Breast surgery  LEFT    LUMPECTOMY  . Oophrectomy      BI LATERERAL FOR CANCER PREVENTON  . Pilonidal cyst excision      REVIEW OF SYSTEMS:  Pertinent items are noted in HPI.   HEALTH MAINTENANCE:  PHYSICAL EXAMINATION: Blood pressure 121/61, pulse 62, temperature  98.8 F (37.1 C), temperature source Oral, resp. rate 18, height '5\' 2"'  (1.575 m), weight 141 lb 6.4 oz (64.139 kg). Body mass index is 25.86 kg/(m^2). ECOG PERFORMANCE STATUS: 0 - Asymptomatic   General appearance: alert, cooperative and appears stated age Resp: clear to auscultation bilaterally Cardio: regular rate and rhythm GI: soft, non-tender; bowel sounds normal; no masses,  no organomegaly Extremities:  extremities normal, atraumatic, no cyanosis or edema Neurologic: Grossly normal Breast examination: Right breast no masses nipple discharge, left breast is no masses or nipple discharge well-healed surgical scar.  LABORATORY DATA: Lab Results  Component Value Date   WBC 5.9 01/29/2014   HGB 13.3 01/29/2014   HCT 40.1 01/29/2014   MCV 86.4 01/29/2014   PLT 213 01/29/2014      Chemistry      Component Value Date/Time   NA 143 01/29/2014 1416   NA 141 12/09/2011 1549   K 4.0 01/29/2014 1416   K 3.7 12/09/2011 1549   CL 106 12/12/2012 1357   CL 106 12/09/2011 1549   CO2 28 01/29/2014 1416   CO2 28 12/09/2011 1549   BUN 12.5 01/29/2014 1416   BUN 11 12/09/2011 1549   CREATININE 0.8 01/29/2014 1416   CREATININE 0.86 12/09/2011 1549      Component Value Date/Time   CALCIUM 9.5 01/29/2014 1416   CALCIUM 8.7 12/09/2011 1549   ALKPHOS 66 01/29/2014 1416   ALKPHOS 65 12/09/2011 1549   AST 26 01/29/2014 1416   AST 26 12/09/2011 1549   ALT 19 01/29/2014 1416   ALT 18 12/09/2011 1549   BILITOT 0.54 01/29/2014 1416   BILITOT 0.6 12/09/2011 1549       RADIOGRAPHIC STUDIES:  No results found.  ASSESSMENT: 53 year old female with  #1 history of stage I (T1 N0) invasive ductal carcinoma of the left breast status post lumpectomy in April 2008 followed by Oncotype DX testing that revealed a recurrence score of 25 with a risk of distant recurrence at 16%. Patient declined chemotherapy.  #2 she proceeded with adjuvant radiation therapy to the left breast given by Dr. Tyler Pita. She completed this in 01/03/2007.  #3 thereafter patient had bilateral salpingo-oophorectomy performed which rendered her postmenopausal. She then went on to receive Arimidex 1 mg daily. She has completed 6 years of this. Now on observation only.   #4 bone density scan performed in March 2014 revealed low bone mass.    PLAN:   #1 She has no evidence of recurrence on exam or mammogram. I have scheduled her next mammogram for  April 2016.  #2 Continue Fosamax 35 mg q. weekly. DEXA bone scan has been requested for April 2016. Continue Calcium supplement. I will call her with her Vitamin D level and make further recommendations about taking this.  #3 I will see her back in one years time for followup.   All questions were answered. The patient knows to call the clinic with any problems, questions or concerns. We can certainly see the patient much sooner if necessary.  I spent 20 minutes counseling the patient face to face. The total time spent in the appointment was 30 minutes.    Mikey Bussing, DNP, AGPCNP-BC  01/29/2014, 3:49 PM

## 2014-01-30 ENCOUNTER — Telehealth: Payer: Self-pay | Admitting: *Deleted

## 2014-01-30 LAB — VITAMIN D 25 HYDROXY (VIT D DEFICIENCY, FRACTURES): VIT D 25 HYDROXY: 69 ng/mL (ref 30–89)

## 2014-01-30 NOTE — Telephone Encounter (Signed)
Message copied by Harmon Pier on Wed Jan 30, 2014 11:11 AM ------      Message from: Minette Headland      Created: Wed Jan 30, 2014 10:32 AM       Please call patient with results.            Thanks,       LC       ----- Message -----         From: [facility] Interface         Sent: 01/29/2014   2:35 PM           To: Minette Headland, NP                   ------

## 2014-01-30 NOTE — Telephone Encounter (Signed)
As noted below, called pt to inform her of Vitamin D results(69). No answer but left a detailed message about results which were normal. Message to be forwarded to Charlestine Massed, NP.

## 2014-02-04 ENCOUNTER — Telehealth: Payer: Self-pay | Admitting: Oncology

## 2014-02-04 NOTE — Telephone Encounter (Signed)
s.w. pt and advised on May and July apt....mailed pt appt sched/avs adn letter

## 2014-02-11 ENCOUNTER — Encounter: Payer: Self-pay | Admitting: Family Medicine

## 2014-02-11 ENCOUNTER — Ambulatory Visit (INDEPENDENT_AMBULATORY_CARE_PROVIDER_SITE_OTHER): Payer: BC Managed Care – PPO | Admitting: Family Medicine

## 2014-02-11 VITALS — BP 124/82 | HR 67 | Temp 98.5°F | Ht 62.0 in | Wt 141.5 lb

## 2014-02-11 DIAGNOSIS — L905 Scar conditions and fibrosis of skin: Secondary | ICD-10-CM

## 2014-02-11 NOTE — Progress Notes (Signed)
   [place] [address] Phone: [phone] Fax: [phone]  Patient ID: [name] MRN: [id number], DOB: October 28, 1960, 53 y.o. Date of Encounter: 02/11/2014  Primary Physician:  Eliezer Lofts, MD   Chief Complaint: Area under left breast   Subjective:   History of Present Illness:  [name] is a 53 y.o. very pleasant female patient who presents with the following:  L breast inferior band of scar tissue. Painful to palpate. Inferior band of tissue that you can palpate.   S/p DCIS, lumpectomy on the L in 2008.  Patient saw oncology 2 weeks ago.   Past Medical History, Surgical History, Social History, Family History, Problem List, Medications, and Allergies have been reviewed and updated if relevant.  Review of Systems:  GEN: No acute illnesses, no fevers, chills. GI: No n/v/d, eating normally Pulm: No SOB Interactive and getting along well at home.  Otherwise, ROS is as per the HPI.  Objective:   Physical Examination: BP 124/82  Pulse 67  Temp(Src) 98.5 F (36.9 C) (Oral)  Ht 5\' 2"  (1.575 m)  Wt 141 lb 8 oz (64.184 kg)  BMI 25.87 kg/m2   GEN: WDWN, NAD, Non-toxic, Alert & Oriented x 3 HEENT: Atraumatic, Normocephalic.  Ears and Nose: No external deformity. EXTR: No clubbing/cyanosis/edema NEURO: Normal gait.  PSYCH: Normally interactive. Conversant. Not depressed or anxious appearing.  Calm demeanor.   Breast: superior scar, inferior prominent band of tissue that is tender to palpation.  This portion of the physical examination was chaperoned by Hedy Camara, CMA.   Laboratory and Imaging Data:  Assessment & Plan:   Scar tissue  I do not think anything to worry about. Band of scar tissue. I had one of my other partners evaluate and she agreed. Reassurance.   Signed,  Maud Deed. Jemarion Roycroft, MD, CAQ Sports Medicine   Discontinued Medications   No medications on file   Current Medications at Discharge:   Medication List       This list is accurate as of: 02/11/14  6:00 PM.  Always use your most recent med list.               alendronate 35 MG tablet  Commonly known as:  FOSAMAX  TAKE 1 TABLET BY MOUTH EVERY 7 DAYS WITH A FULL GLASS OF WATER     CALCIUM 600+D 600-400 MG-UNIT per tablet  Generic drug:  Calcium Carbonate-Vitamin D  Take 1 tablet by mouth daily.     simvastatin 40 MG tablet  Commonly known as:  ZOCOR  TAKE 1 TABLET BY MOUTH EVERY OTHER NIGHT     VIACTIV PO  Take 2 tablets by mouth daily.

## 2014-02-11 NOTE — Progress Notes (Signed)
Pre visit review using our clinic review tool, if applicable. No additional management support is needed unless otherwise documented below in the visit note. 

## 2014-03-01 ENCOUNTER — Telehealth: Payer: Self-pay | Admitting: Hematology and Oncology

## 2014-03-01 NOTE — Telephone Encounter (Signed)
former KK pt to [facility] requesting female provider. ok w/NG to schedule pt w/her. s/w pt today re lb/NG 7/19 and mailed schedule.

## 2014-08-05 ENCOUNTER — Other Ambulatory Visit: Payer: Self-pay | Admitting: Family Medicine

## 2014-08-27 ENCOUNTER — Telehealth: Payer: Self-pay | Admitting: *Deleted

## 2014-08-27 NOTE — Telephone Encounter (Signed)
Patient called requesting "if she has ever had a CTX test and what the numbers.  Also asked when she started her Fosamax."  Her dentist asked about this test and a number and is skeptical about doing an implant on her tooth until he gets this information.   DEXA Scan 10-16-2012 noted.  Earliest date of Fosamax found is 12-25-2013.  Advised she call her Pharmacy for original order information and sign for release of records at Medical Records.

## 2014-09-07 ENCOUNTER — Encounter: Payer: Self-pay | Admitting: Gastroenterology

## 2014-09-09 ENCOUNTER — Telehealth: Payer: Self-pay | Admitting: Family Medicine

## 2014-09-09 DIAGNOSIS — M8580 Other specified disorders of bone density and structure, unspecified site: Secondary | ICD-10-CM

## 2014-09-09 DIAGNOSIS — E7800 Pure hypercholesterolemia, unspecified: Secondary | ICD-10-CM

## 2014-09-09 NOTE — Telephone Encounter (Signed)
-----   Message from Ellamae Sia sent at 09/04/2014  5:39 PM EST ----- Regarding: Lab orders for Tuesday,2.23.16 Patient is scheduled for CPX labs, please order future labs, Thanks , Karna Christmas

## 2014-09-10 ENCOUNTER — Other Ambulatory Visit (INDEPENDENT_AMBULATORY_CARE_PROVIDER_SITE_OTHER): Payer: BLUE CROSS/BLUE SHIELD

## 2014-09-10 DIAGNOSIS — M8580 Other specified disorders of bone density and structure, unspecified site: Secondary | ICD-10-CM

## 2014-09-10 DIAGNOSIS — E7800 Pure hypercholesterolemia, unspecified: Secondary | ICD-10-CM

## 2014-09-12 ENCOUNTER — Encounter: Payer: Self-pay | Admitting: Family Medicine

## 2014-09-17 ENCOUNTER — Ambulatory Visit (INDEPENDENT_AMBULATORY_CARE_PROVIDER_SITE_OTHER): Payer: BLUE CROSS/BLUE SHIELD | Admitting: Family Medicine

## 2014-09-17 ENCOUNTER — Encounter: Payer: Self-pay | Admitting: Family Medicine

## 2014-09-17 VITALS — BP 100/64 | HR 56 | Temp 98.3°F | Ht 62.5 in | Wt 138.5 lb

## 2014-09-17 DIAGNOSIS — E7800 Pure hypercholesterolemia, unspecified: Secondary | ICD-10-CM

## 2014-09-17 DIAGNOSIS — Z0000 Encounter for general adult medical examination without abnormal findings: Secondary | ICD-10-CM

## 2014-09-17 NOTE — Progress Notes (Signed)
Pre visit review using our clinic review tool, if applicable. No additional management support is needed unless otherwise documented below in the visit note. 

## 2014-09-17 NOTE — Assessment & Plan Note (Signed)
Encouraged exercise, weight loss, healthy eating habits. Continue simvastatin, at goal < 1309.

## 2014-09-17 NOTE — Progress Notes (Signed)
HPI  The patient is here for annual wellness exam and preventative care.   Feeling well overall.   Elevated Cholesterol: At goal < 130 simvastatin 40 mg every other night  daily.  Father with heart disease at early age LDL 111 HDL 50 trig 95 Using medications without problems: None Muscle aches: None Other complaints:  Exercise: Walking on treadmill daily or rowing machine.  Left breast carcinoma in situ...s/p lumpectomy.  Dr Humphrey Rolls...sees her yearly Dx in 2008.  On arimedex.  Has history of BSO due to risk of ovarian cancer. Still has uterus and ovaries.    Review of Systems  Constitutional: Negative for fever, fatigue and unexpected weight change.  HENT: Negative for ear pain, congestion, sore throat, sneezing, trouble swallowing and sinus pressure.  Eyes: Negative for pain and itching.  Respiratory: Negative for cough, shortness of breath and wheezing.  Cardiovascular: Negative for chest pain, palpitations and leg swelling.  Gastrointestinal: Negative for nausea, abdominal pain, diarrhea, constipation and blood in stool.  Genitourinary: Negative for dysuria, hematuria, vaginal discharge, difficulty urinating and menstrual problem.  Skin: Negative for rash.  Neurological: Negative for syncope, weakness, light-headedness, numbness and headaches.  Psychiatric/Behavioral: Negative for confusion and dysphoric mood. The patient is not nervous/anxious.  Objective:   Physical Exam  Constitutional: Vital signs are normal. She appears well-developed and well-nourished. She is cooperative. Non-toxic appearance. She does not appear ill. No distress.  HENT:  Head: Normocephalic.  Right Ear: Hearing, tympanic membrane, external ear and ear canal normal.  Left Ear: Hearing, tympanic membrane, external ear and ear canal normal.  Nose: Nose normal.  Eyes: Conjunctivae, EOM and lids are normal. Pupils are equal, round, and reactive to light. No foreign bodies found.   Neck: Trachea normal and normal range of motion. Neck supple. Carotid bruit is not present. No mass and no thyromegaly present.  Cardiovascular: Normal rate, regular rhythm, S1 normal, S2 normal, normal heart sounds and intact distal pulses. Exam reveals no gallop.  No murmur heard.  Pulmonary/Chest: Effort normal and breath sounds normal. No respiratory distress. She has no wheezes. She has no rhonchi. She has no rales.  Abdominal: Soft. Normal appearance and bowel sounds are normal. She exhibits no distension, no fluid wave, no abdominal bruit and no mass. There is no hepatosplenomegaly. There is no tenderness. There is no rebound, no guarding and no CVA tenderness. No hernia.  Genitourinary: Vagina normal. Pelvic exam was performed with patient prone. No labial fusion. There is no rash, tenderness, lesion or injury on the right labia. There is no rash, tenderness, lesion or injury on the left labia. No ovaries Uterus is not enlarged and not tender.  Breast exam per Humphrey Rolls Pap performed  Lymphadenopathy:  She has no cervical adenopathy.  She has no axillary adenopathy.  Neurological: She is alert. She has normal strength. No cranial nerve deficit or sensory deficit.  Skin: Skin is warm, dry and intact. No rash noted.  Psychiatric: Her speech is normal and behavior is normal. Judgment normal. Her mood appears not anxious. Cognition and memory are normal. She does not exhibit a depressed mood.  Assessment & Plan:   Complete Physical Exam; The patient's preventative maintenance and recommended screening tests for an annual wellness exam were reviewed in full today.  Brought up to date unless services declined.  Counselled on the importance of diet, exercise, and its role in overall health and mortality.  The patient's FH and SH was reviewed, including their home life, tobacco status, and drug  and alcohol status.    Continue DVE yearly given uterus still present. S/P BSO PAPs every 3  years, last nml 2015  Breast exam at [facility] in 01/2015 DEXA: 10/2012: osteopenia (but was high risk since on Arimidex, now off arimidex for 2 years, she is was fosamax ( was on for almost 2 years) to prevent deterioration, but has stopped now)  She is schedule for DEXA in 11/2013. Vit D nml. Vaccines: uptodate with tetanus.  Refused flu. Refused HIV screen.  Refused hep C screen.

## 2014-11-06 ENCOUNTER — Other Ambulatory Visit: Payer: Self-pay | Admitting: Family Medicine

## 2014-11-18 ENCOUNTER — Other Ambulatory Visit: Payer: Self-pay | Admitting: Oncology

## 2014-11-18 ENCOUNTER — Ambulatory Visit
Admission: RE | Admit: 2014-11-18 | Discharge: 2014-11-18 | Disposition: A | Discharge status: Home / Self Care | Payer: BLUE CROSS/BLUE SHIELD | Source: Ambulatory Visit | Attending: Oncology | Admitting: Oncology

## 2014-11-18 DIAGNOSIS — D0590 Unspecified type of carcinoma in situ of unspecified breast: Secondary | ICD-10-CM

## 2014-11-18 DIAGNOSIS — M8580 Other specified disorders of bone density and structure, unspecified site: Secondary | ICD-10-CM

## 2015-02-03 ENCOUNTER — Telehealth: Payer: Self-pay | Admitting: *Deleted

## 2015-02-03 NOTE — Telephone Encounter (Signed)
Patient called requesting to talk with person who called her earlier today.  Reports she was told she does not need labs tomorrow. Call transferred to scheduler.

## 2015-02-04 ENCOUNTER — Telehealth: Payer: Self-pay | Admitting: *Deleted

## 2015-02-04 ENCOUNTER — Other Ambulatory Visit: Payer: Self-pay | Admitting: *Deleted

## 2015-02-04 ENCOUNTER — Ambulatory Visit: Payer: BC Managed Care – PPO | Admitting: Hematology and Oncology

## 2015-02-04 ENCOUNTER — Other Ambulatory Visit: Payer: BC Managed Care – PPO

## 2015-02-04 ENCOUNTER — Telehealth: Payer: Self-pay | Admitting: Hematology

## 2015-02-04 ENCOUNTER — Other Ambulatory Visit: Payer: Self-pay | Admitting: Hematology

## 2015-02-04 DIAGNOSIS — D0590 Unspecified type of carcinoma in situ of unspecified breast: Secondary | ICD-10-CM

## 2015-02-04 NOTE — Telephone Encounter (Signed)
Patient called and left message that she wants a lab appt. And then 45minutes later to see Dr. Burr Medico like she did with her other doctor.   Currently patient is scheduled to see Dr. Burr Medico only.  Will let Dr. Burr Medico know and see if she wants to schedule labs.  Patient's call back is [phone]

## 2015-02-04 NOTE — Telephone Encounter (Signed)
Pt is insisting on having labs drawn with MD visit. Dr Alvy Bimler does not draw routine labs for breast cancer follow up. Dr Alvy Bimler spoke with Dr Burr Medico who is willing to see patient. Pt is agreeable.

## 2015-02-04 NOTE — Telephone Encounter (Signed)
s.w. pt and advised on july appt with Dr. Burr Medico...change in provider ok per pof...pt ok and aware

## 2015-02-04 NOTE — Telephone Encounter (Signed)
I will request and order lab for her. Thanks  Truitt Merle

## 2015-02-05 ENCOUNTER — Telehealth: Payer: Self-pay | Admitting: Hematology

## 2015-02-05 ENCOUNTER — Other Ambulatory Visit: Payer: BLUE CROSS/BLUE SHIELD

## 2015-02-05 NOTE — Telephone Encounter (Signed)
per pof to sch pt lab prior to appt-cld & spoke to pt and adv of lab appt-pt undertood

## 2015-02-10 ENCOUNTER — Other Ambulatory Visit (HOSPITAL_BASED_OUTPATIENT_CLINIC_OR_DEPARTMENT_OTHER): Payer: BLUE CROSS/BLUE SHIELD

## 2015-02-10 ENCOUNTER — Telehealth: Payer: Self-pay | Admitting: Hematology

## 2015-02-10 ENCOUNTER — Other Ambulatory Visit: Payer: Self-pay | Admitting: Hematology

## 2015-02-10 ENCOUNTER — Encounter: Payer: Self-pay | Admitting: Hematology

## 2015-02-10 ENCOUNTER — Ambulatory Visit (HOSPITAL_BASED_OUTPATIENT_CLINIC_OR_DEPARTMENT_OTHER): Payer: BLUE CROSS/BLUE SHIELD | Admitting: Hematology

## 2015-02-10 VITALS — BP 129/76 | HR 95 | Temp 98.4°F | Resp 18 | Ht 62.5 in | Wt 139.1 lb

## 2015-02-10 DIAGNOSIS — C50412 Malignant neoplasm of upper-outer quadrant of left female breast: Secondary | ICD-10-CM | POA: Diagnosis not present

## 2015-02-10 DIAGNOSIS — Z170 Estrogen receptor positive status [ER+]: Secondary | ICD-10-CM | POA: Diagnosis not present

## 2015-02-10 DIAGNOSIS — Z9889 Other specified postprocedural states: Secondary | ICD-10-CM

## 2015-02-10 DIAGNOSIS — Z1231 Encounter for screening mammogram for malignant neoplasm of breast: Secondary | ICD-10-CM

## 2015-02-10 DIAGNOSIS — Z853 Personal history of malignant neoplasm of breast: Secondary | ICD-10-CM | POA: Insufficient documentation

## 2015-02-10 DIAGNOSIS — Z79811 Long term (current) use of aromatase inhibitors: Secondary | ICD-10-CM | POA: Diagnosis not present

## 2015-02-10 DIAGNOSIS — M8580 Other specified disorders of bone density and structure, unspecified site: Secondary | ICD-10-CM

## 2015-02-10 DIAGNOSIS — D0590 Unspecified type of carcinoma in situ of unspecified breast: Secondary | ICD-10-CM

## 2015-02-10 LAB — COMPREHENSIVE METABOLIC PANEL (CC13)
ALT: 19 U/L (ref 0–55)
AST: 24 U/L (ref 5–34)
Albumin: 4 g/dL (ref 3.5–5.0)
Alkaline Phosphatase: 77 U/L (ref 40–150)
Anion Gap: 8 mEq/L (ref 3–11)
BUN: 7.8 mg/dL (ref 7.0–26.0)
CHLORIDE: 105 meq/L (ref 98–109)
CO2: 30 mEq/L — ABNORMAL HIGH (ref 22–29)
Calcium: 10 mg/dL (ref 8.4–10.4)
Creatinine: 0.8 mg/dL (ref 0.6–1.1)
EGFR: 90 mL/min/{1.73_m2} — ABNORMAL LOW (ref >90)
Glucose: 90 mg/dl (ref 70–140)
Potassium: 4.4 mEq/L (ref 3.5–5.1)
Sodium: 143 mEq/L (ref 136–145)
TOTAL PROTEIN: 7.3 g/dL (ref 6.4–8.3)
Total Bilirubin: 0.69 mg/dL (ref 0.20–1.20)

## 2015-02-10 LAB — CBC WITH DIFFERENTIAL/PLATELET
BASO%: 0.4 % (ref 0.0–2.0)
Basophils Absolute: 0 10*3/uL (ref 0.0–0.1)
EOS ABS: 0.1 10*3/uL (ref 0.0–0.5)
EOS%: 1.3 % (ref 0.0–7.0)
HCT: 40.9 % (ref 34.8–46.6)
HGB: 13.8 g/dL (ref 11.6–15.9)
LYMPH%: 34.4 % (ref 14.0–49.7)
MCH: 29.6 pg (ref 25.1–34.0)
MCHC: 33.7 g/dL (ref 31.5–36.0)
MCV: 87.6 fL (ref 79.5–101.0)
MONO#: 0.4 10*3/uL (ref 0.1–0.9)
MONO%: 8 % (ref 0.0–14.0)
NEUT#: 3.1 10*3/uL (ref 1.5–6.5)
NEUT%: 55.9 % (ref 38.4–76.8)
Platelets: 222 10*3/uL (ref 145–400)
RBC: 4.67 10*6/uL (ref 3.70–5.45)
RDW: 13.3 % (ref 11.2–14.5)
WBC: 5.5 10*3/uL (ref 3.9–10.3)
lymph#: 1.9 10*3/uL (ref 0.9–3.3)

## 2015-02-10 NOTE — Addendum Note (Signed)
Addended by: Truitt Merle on: 02/10/2015 03:13 PM   Modules accepted: Orders

## 2015-02-10 NOTE — Progress Notes (Signed)
[place]  Telephone:(336) [phone] Fax:(336) [phone]  Clinic Follow up Note   Patient Care Team: Jinny Sanders, MD as PCP - General 02/10/2015  SUMMARY OF ONCOLOGIC HISTORY: Oncology History   Breast cancer of upper-outer quadrant of left female breast   Staging form: Breast, AJCC 7th Edition     Pathologic: Stage IA (T1b, N0, cM0) - Unsigned        Breast cancer of upper-outer quadrant of left female breast   05/2006 Initial Diagnosis Breast cancer of upper-outer quadrant of left female breast   05/2006 Initial Biopsy  left breast mass biopsy showed invasive ductal carcinoma, the biopsy was done [facility]   05/2006 Receptors her2 ER positive, PR positive, HER-2 negative   05/2006 Imaging Mammogram and ultrasound showed a 8 mm mass in the left breast 10:00 position   10/11/2006 Pathology Results  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 0 cm, 2 sentinel lymph nodes negative, (+)  DCIS   10/11/2006 Surgery Left lumpectomy with negative margins.   11/22/2006 - 12/24/2006 Radiation Therapy  left breast irradiation, under Dr. [name]    02/2007 Surgery  bilateral salpingo-oophorectomy    03/2007 - 12/2012 Anti-estrogen oral therapy adjuvant Arimidex 4m daily     INTERVAL HISTORY: Ms CHanisch Returns for annual follow-up.  She was previously under Dr. RTruddie Cocoand Dr. KLaurelyn Sicklecare,  Who post have left the practice. This is my first encounter with her. She presents to the clinic with her mother.  She is doing very well ,  Denies any pain, dyspnea, abdominal discomfort, or any other symptoms.  She has good appetite and eating well, she is fairly active physically.  REVIEW OF SYSTEMS:   Constitutional: Denies fevers, chills or abnormal weight loss Eyes: Denies blurriness of vision Ears, nose, mouth, throat, and face: Denies mucositis or sore throat Respiratory: Denies cough, dyspnea or wheezes Cardiovascular: Denies palpitation, chest discomfort or  lower extremity swelling Gastrointestinal:  Denies nausea, heartburn or change in bowel habits Skin: Denies abnormal skin rashes Lymphatics: Denies new lymphadenopathy or easy bruising Neurological:Denies numbness, tingling or new weaknesses Behavioral/Psych: Mood is stable, no new changes  All other systems were reviewed with the patient and are negative.  MEDICAL HISTORY:  Past Medical History  Diagnosis Date  . Breast cancer   . Hyperlipidemia   . Osteopenia     SURGICAL HISTORY: Past Surgical History  Procedure Laterality Date  . Breast surgery  LEFT    LUMPECTOMY  . Oophrectomy      BI LATERERAL FOR CANCER PREVENTON  . Pilonidal cyst excision      I have reviewed the social history and family history with the patient and they are unchanged from previous note.  ALLERGIES:  has No Known Allergies.  MEDICATIONS:  Current Outpatient Prescriptions  Medication Sig Dispense Refill  . Calcium-Vitamin D-Vitamin K (VIACTIV PO) Take 2 tablets by mouth daily.    . simvastatin (ZOCOR) 40 MG tablet TAKE 1 TABLET BY MOUTH EVERY OTHER NIGHT 45 tablet 3   No current facility-administered medications for this visit.    PHYSICAL EXAMINATION: ECOG PERFORMANCE STATUS: 0 - Asymptomatic  Filed Vitals:   02/10/15 1432  BP: 129/76  Pulse: 95  Temp: 98.4 F (36.9 C)  Resp: 18   Filed Weights   02/10/15 1432  Weight: 139 lb 1.6 oz (63.095 kg)    GENERAL:alert, no distress and comfortable SKIN: skin color, texture, turgor are normal, no rashes or significant lesions  EYES: normal, Conjunctiva are pink and non-injected, sclera clear OROPHARYNX:no exudate, no erythema and lips, buccal mucosa, and tongue normal  NECK: supple, thyroid normal size, non-tender, without nodularity LYMPH:  no palpable lymphadenopathy in the cervical, axillary or inguinal LUNGS: clear to auscultation and percussion with normal breathing effort HEART: regular rate & rhythm and no murmurs and no lower  extremity edema ABDOMEN:abdomen soft, non-tender and normal bowel sounds Musculoskeletal:no cyanosis of digits and no clubbing  NEURO: alert & oriented x 3 with fluent speech, no focal motor/sensory deficits  LABORATORY DATA:  I have reviewed the data as listed CBC Latest Ref Rng 02/10/2015 01/29/2014 03/30/2013  WBC 3.9 - 10.3 10e3/uL 5.5 5.9 6.1  Hemoglobin 11.6 - 15.9 g/dL 13.8 13.3 13.1  Hematocrit 34.8 - 46.6 % 40.9 40.1 38.6  Platelets 145 - 400 10e3/uL 222 213 222     CMP Latest Ref Rng 02/10/2015 01/29/2014 03/30/2013  Glucose 70 - 140 mg/dl 90 84 91  BUN 7.0 - 26.0 mg/dL 7.8 12.5 9.5  Creatinine 0.6 - 1.1 mg/dL 0.8 0.8 0.8  Sodium 136 - 145 mEq/L 143 143 143  Potassium 3.5 - 5.1 mEq/L 4.4 4.0 3.8  Chloride 98 - 107 mEq/L - - -  CO2 22 - 29 mEq/L 30(H) 28 28  Calcium 8.4 - 10.4 mg/dL 10.0 9.5 8.9  Total Protein 6.4 - 8.3 g/dL 7.3 7.0 7.0  Total Bilirubin 0.20 - 1.20 mg/dL 0.69 0.54 0.48  Alkaline Phos 40 - 150 U/L 77 66 75  AST 5 - 34 U/L '24 26 25  ' ALT 0 - 55 U/L '19 19 21      ' RADIOGRAPHIC STUDIES: I have personally reviewed the radiological images as listed and agreed with the findings in the report. No results found.   ASSESSMENT & PLAN:   54 year old Caucasian female,  Postmenopausal after surgery.  1. 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  - she is clinically doing very well , physical exam and her last mammogram in May 2016 had no evidence of recurrence - her lab reviewed, CBC and CMP within normal limits - we discussed that she is 8 years out of her initial diagnosis, the risk of  Cancer recurrence is much smaller now , also we do sometimes see later recurrence in ER/PR positive breast cancer - she will continue annual mammogram and a visit  With [place] - we discussed a healthy diet and regular exercise,  Maintain normal BMI,  To promote  overall health and reduce her risk of cancer  2.   Osteopenia - her bone density got worse when she was on  Arimidex,  She was treated with Fosamax,  Which was held in February 2016 due to her some dental issue - her repeat a bone density scan  In May 2016 showed stable osteopenia, T score -1.2,  Similar to her bone density scan in 2014 - I encouraged her to take calcium at least 1 g and vitamin D at least 1000 units daily,  And weight-bearing exercise  Follow up:  In 1 year with lab. Mammogram in 11/2015  All questions were answered. The patient knows to call the clinic with any problems, questions or concerns. No barriers to learning was detected.  I spent 20 minutes counseling the patient face to face. The total time spent in the appointment was 30 minutes and  more than 50% was on counseling and review of test results     Truitt Merle, MD 02/10/2015 3:03 PM

## 2015-02-10 NOTE — Telephone Encounter (Signed)
per pof to sch pt appt-gave pt copy of avs-[facility] and made mamma appt gave pt appt time/location-gave pt avs

## 2015-09-16 ENCOUNTER — Telehealth: Payer: Self-pay | Admitting: Family Medicine

## 2015-09-16 ENCOUNTER — Other Ambulatory Visit (INDEPENDENT_AMBULATORY_CARE_PROVIDER_SITE_OTHER): Payer: BLUE CROSS/BLUE SHIELD

## 2015-09-16 DIAGNOSIS — Z1159 Encounter for screening for other viral diseases: Secondary | ICD-10-CM

## 2015-09-16 DIAGNOSIS — E7800 Pure hypercholesterolemia, unspecified: Secondary | ICD-10-CM

## 2015-09-16 DIAGNOSIS — M8580 Other specified disorders of bone density and structure, unspecified site: Secondary | ICD-10-CM

## 2015-09-16 NOTE — Telephone Encounter (Signed)
-----   Message from Ellamae Sia sent at 09/10/2015  3:18 PM EST ----- Regarding: Lab orders for Tuesday,2.28.17 Patient is scheduled for CPX labs, please order future labs, Thanks , Terri  Please order for [facility]

## 2015-09-17 LAB — LIPID PANEL
Cholesterol: 175 mg/dL (ref 125–200)
HDL: 51 mg/dL (ref >=46)
LDL CALC: 108 mg/dL (ref <130)
TRIGLYCERIDES: 82 mg/dL (ref <150)
Total CHOL/HDL Ratio: 3.4 Ratio (ref <=5.0)
VLDL: 16 mg/dL (ref <30)

## 2015-09-17 LAB — COMPREHENSIVE METABOLIC PANEL
ALBUMIN: 3.8 g/dL (ref 3.6–5.1)
ALT: 17 U/L (ref 6–29)
AST: 21 U/L (ref 10–35)
Alkaline Phosphatase: 56 U/L (ref 33–130)
BUN: 10 mg/dL (ref 7–25)
CALCIUM: 8.9 mg/dL (ref 8.6–10.4)
CHLORIDE: 106 mmol/L (ref 98–110)
CO2: 26 mmol/L (ref 20–31)
Creat: 0.78 mg/dL (ref 0.50–1.05)
GLUCOSE: 93 mg/dL (ref 65–99)
Potassium: 4 mmol/L (ref 3.5–5.3)
Sodium: 142 mmol/L (ref 135–146)
Total Bilirubin: 0.5 mg/dL (ref 0.2–1.2)
Total Protein: 6.2 g/dL (ref 6.1–8.1)

## 2015-09-17 LAB — HEPATITIS C ANTIBODY: HCV Ab: NEGATIVE

## 2015-09-17 LAB — VITAMIN D 25 HYDROXY (VIT D DEFICIENCY, FRACTURES): VIT D 25 HYDROXY: 35 ng/mL (ref 30–100)

## 2015-09-23 ENCOUNTER — Encounter: Payer: Self-pay | Admitting: Family Medicine

## 2015-09-23 ENCOUNTER — Ambulatory Visit (INDEPENDENT_AMBULATORY_CARE_PROVIDER_SITE_OTHER): Payer: BLUE CROSS/BLUE SHIELD | Admitting: Family Medicine

## 2015-09-23 VITALS — BP 104/72 | HR 64 | Temp 98.2°F | Ht 62.0 in | Wt 143.0 lb

## 2015-09-23 DIAGNOSIS — E7800 Pure hypercholesterolemia, unspecified: Secondary | ICD-10-CM | POA: Diagnosis not present

## 2015-09-23 DIAGNOSIS — Z0000 Encounter for general adult medical examination without abnormal findings: Secondary | ICD-10-CM | POA: Diagnosis not present

## 2015-09-23 DIAGNOSIS — M8580 Other specified disorders of bone density and structure, unspecified site: Secondary | ICD-10-CM | POA: Diagnosis not present

## 2015-09-23 NOTE — Assessment & Plan Note (Signed)
Stable control on simvastatin. Encouraged exercise, weight maintance, healthy eating habits.

## 2015-09-23 NOTE — Progress Notes (Signed)
Pre visit review using our clinic review tool, if applicable. No additional management support is needed unless otherwise documented below in the visit note. 

## 2015-09-23 NOTE — Assessment & Plan Note (Signed)
Repeat bone density in 2018.

## 2015-09-23 NOTE — Progress Notes (Signed)
HPI  The patient is here for annual wellness exam and preventative care.   Feeling well overall.   Elevated Cholesterol: At goal < 130 simvastatin 40 mg every other daily.  Father with heart disease at early age Lab Results  Component Value Date   CHOL 175 09/16/2015   HDL 51 09/16/2015   LDLCALC 108 09/16/2015   LDLDIRECT 168.3 07/15/2008   TRIG 82 09/16/2015   CHOLHDL 3.4 09/16/2015  Using medications without problems: None Muscle aches: None Other complaints: Exercise: Walking on treadmill daily or rowing machine. Diet: healthy diet  Left breast carcinoma in situ...s/p lumpectomy.  Dr Burr Medico...sees her yearly Dx in 2008.  On arimedex.  Has history of BSO due to risk of ovarian cancer. Still has uterus and ovaries.    Review of Systems  Constitutional: Negative for fever, fatigue and unexpected weight change.  HENT: Negative for ear pain, congestion, sore throat, sneezing, trouble swallowing and sinus pressure.  Eyes: Negative for pain and itching.  Respiratory: Negative for cough, shortness of breath and wheezing.  Cardiovascular: Negative for chest pain, palpitations and leg swelling.  Gastrointestinal: Negative for nausea, abdominal pain, diarrhea, constipation and blood in stool.  Genitourinary: Negative for dysuria, hematuria, vaginal discharge, difficulty urinating and menstrual problem.  Skin: Negative for rash.  Neurological: Negative for syncope, weakness, light-headedness, numbness and headaches.  Psychiatric/Behavioral: Negative for confusion and dysphoric mood. The patient is not nervous/anxious.  Objective:   Physical Exam  Constitutional: Vital signs are normal. She appears well-developed and well-nourished. She is cooperative. Non-toxic appearance. She does not appear ill. No distress.  HENT:  Head: Normocephalic.  Right Ear: Hearing, tympanic membrane, external ear and ear canal normal.  Left Ear: Hearing, tympanic membrane, external  ear and ear canal normal.  Nose: Nose normal.  Eyes: Conjunctivae, EOM and lids are normal. Pupils are equal, round, and reactive to light. No foreign bodies found.  Neck: Trachea normal and normal range of motion. Neck supple. Carotid bruit is not present. No mass and no thyromegaly present.  Cardiovascular: Normal rate, regular rhythm, S1 normal, S2 normal, normal heart sounds and intact distal pulses. Exam reveals no gallop.  No murmur heard.  Pulmonary/Chest: Effort normal and breath sounds normal. No respiratory distress. She has no wheezes. She has no rhonchi. She has no rales.  Abdominal: Soft. Normal appearance and bowel sounds are normal. She exhibits no distension, no fluid wave, no abdominal bruit and no mass. There is no hepatosplenomegaly. There is no tenderness. There is no rebound, no guarding and no CVA tenderness. No hernia.  Genitourinary: Vagina normal. Pelvic exam was performed with patient prone. No labial fusion. There is no rash, tenderness, lesion or injury on the right labia. There is no rash, tenderness, lesion or injury on the left labia. No ovaries Uterus is not enlarged and not tender.  Breast exam per Humphrey Rolls  No Pap performed  Lymphadenopathy:  She has no cervical adenopathy.  She has no axillary adenopathy.  Neurological: She is alert. She has normal strength. No cranial nerve deficit or sensory deficit.  Skin: Skin is warm, dry and intact. No rash noted.  Psychiatric: Her speech is normal and behavior is normal. Judgment normal. Her mood appears not anxious. Cognition and memory are normal. She does not exhibit a depressed mood.  Assessment & Plan:   Complete Physical Exam; The patient's preventative maintenance and recommended screening tests for an annual wellness exam were reviewed in full today.  Brought up to date unless services  declined.  Counselled on the importance of diet, exercise, and its role in overall health and mortality.  The  patient's FH and SH was reviewed, including their home life, tobacco status, and drug and alcohol status.    Continue DVE yearly given uterus still present. S/P BSO PAPs every 3 years, last nml 2015  Breast exam today DEXA: 10/2012: osteopenia (but was high risk since on Arimidex, now off arimidex for 3 years, she is was fosamax ( was on for almost 2 years) to prevent deterioration, but has stopped now) Stable DEXA in 11/2014, plan repeat in 2018. Vaccines: uptodate with tetanus. Refused flu. Refused HIV screen.  Hep C screen: done

## 2015-11-21 ENCOUNTER — Ambulatory Visit
Admission: RE | Admit: 2015-11-21 | Discharge: 2015-11-21 | Disposition: A | Discharge status: Home / Self Care | Payer: BLUE CROSS/BLUE SHIELD | Source: Ambulatory Visit | Attending: Hematology | Admitting: Hematology

## 2015-11-21 DIAGNOSIS — Z1231 Encounter for screening mammogram for malignant neoplasm of breast: Secondary | ICD-10-CM

## 2015-11-21 DIAGNOSIS — Z9889 Other specified postprocedural states: Secondary | ICD-10-CM

## 2015-11-21 DIAGNOSIS — Z853 Personal history of malignant neoplasm of breast: Secondary | ICD-10-CM

## 2016-01-05 ENCOUNTER — Other Ambulatory Visit: Payer: Self-pay | Admitting: Family Medicine

## 2016-02-10 ENCOUNTER — Other Ambulatory Visit (HOSPITAL_BASED_OUTPATIENT_CLINIC_OR_DEPARTMENT_OTHER): Payer: BLUE CROSS/BLUE SHIELD

## 2016-02-10 ENCOUNTER — Ambulatory Visit (HOSPITAL_BASED_OUTPATIENT_CLINIC_OR_DEPARTMENT_OTHER): Payer: BLUE CROSS/BLUE SHIELD | Admitting: Hematology

## 2016-02-10 ENCOUNTER — Encounter: Payer: Self-pay | Admitting: Hematology

## 2016-02-10 ENCOUNTER — Telehealth: Payer: Self-pay | Admitting: Hematology

## 2016-02-10 VITALS — BP 109/64 | HR 70 | Temp 98.2°F | Resp 18 | Ht 62.0 in | Wt 145.1 lb

## 2016-02-10 DIAGNOSIS — Z853 Personal history of malignant neoplasm of breast: Secondary | ICD-10-CM | POA: Diagnosis not present

## 2016-02-10 DIAGNOSIS — C50412 Malignant neoplasm of upper-outer quadrant of left female breast: Secondary | ICD-10-CM

## 2016-02-10 DIAGNOSIS — M8580 Other specified disorders of bone density and structure, unspecified site: Secondary | ICD-10-CM | POA: Diagnosis not present

## 2016-02-10 LAB — COMPREHENSIVE METABOLIC PANEL
ALBUMIN: 3.7 g/dL (ref 3.5–5.0)
ALK PHOS: 74 U/L (ref 40–150)
ALT: 18 U/L (ref 0–55)
ANION GAP: 9 meq/L (ref 3–11)
AST: 23 U/L (ref 5–34)
BILIRUBIN TOTAL: 0.62 mg/dL (ref 0.20–1.20)
BUN: 9.8 mg/dL (ref 7.0–26.0)
CALCIUM: 9.4 mg/dL (ref 8.4–10.4)
CO2: 27 mEq/L (ref 22–29)
CREATININE: 0.8 mg/dL (ref 0.6–1.1)
Chloride: 105 mEq/L (ref 98–109)
EGFR: 79 mL/min/{1.73_m2} — ABNORMAL LOW (ref >90)
Glucose: 85 mg/dl (ref 70–140)
Potassium: 4.2 mEq/L (ref 3.5–5.1)
Sodium: 141 mEq/L (ref 136–145)
Total Protein: 7.3 g/dL (ref 6.4–8.3)

## 2016-02-10 LAB — CBC WITH DIFFERENTIAL/PLATELET
BASO%: 0.7 % (ref 0.0–2.0)
BASOS ABS: 0 10*3/uL (ref 0.0–0.1)
EOS%: 1.2 % (ref 0.0–7.0)
Eosinophils Absolute: 0.1 10*3/uL (ref 0.0–0.5)
HEMATOCRIT: 41.2 % (ref 34.8–46.6)
HEMOGLOBIN: 13.7 g/dL (ref 11.6–15.9)
LYMPH#: 1.7 10*3/uL (ref 0.9–3.3)
LYMPH%: 30.7 % (ref 14.0–49.7)
MCH: 28.5 pg (ref 25.1–34.0)
MCHC: 33.1 g/dL (ref 31.5–36.0)
MCV: 86 fL (ref 79.5–101.0)
MONO#: 0.4 10*3/uL (ref 0.1–0.9)
MONO%: 7.5 % (ref 0.0–14.0)
NEUT#: 3.3 10*3/uL (ref 1.5–6.5)
NEUT%: 59.9 % (ref 38.4–76.8)
PLATELETS: 234 10*3/uL (ref 145–400)
RBC: 4.79 10*6/uL (ref 3.70–5.45)
RDW: 13.8 % (ref 11.2–14.5)
WBC: 5.4 10*3/uL (ref 3.9–10.3)

## 2016-02-10 NOTE — Progress Notes (Signed)
[place]  Telephone:([phone] Fax:(336) [phone]  Clinic Follow up Note   Patient Care Team: Jinny Sanders, MD as PCP - General 02/10/2016  SUMMARY OF ONCOLOGIC HISTORY: Oncology History   Breast cancer of upper-outer quadrant of left female breast   Staging form: Breast, AJCC 7th Edition     Pathologic: Stage IA (T1b, N0, cM0) - Unsigned        Breast cancer of upper-outer quadrant of left female breast ([place])   05/2006 Initial Diagnosis    Breast cancer of upper-outer quadrant of left female breast     05/2006 Initial Biopsy     left breast mass biopsy showed invasive ductal carcinoma, the biopsy was [facility] in [facility]     05/2006 Receptors her2    ER positive, PR positive, HER-2 negative     05/2006 Imaging    Mammogram and ultrasound showed a 8 mm mass in the left breast 10:00 position     10/11/2006 Pathology Results     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 0 cm, 2 sentinel lymph nodes negative, (+)  DCIS     10/11/2006 Surgery    Left lumpectomy with negative margins.     11/22/2006 - 12/24/2006 Radiation Therapy     left breast irradiation, under Dr. [name]      02/2007 Surgery     bilateral salpingo-oophorectomy      03/2007 - 12/2012 Anti-estrogen oral therapy    adjuvant Arimidex 78m daily       INTERVAL HISTORY: [name] Returns for annual follow-up. She was last seen by me 1 year ago. She is doing very well, denies any pain, or other discomfort. She has good appetite and energy level, weight has been stable. 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  REVIEW OF SYSTEMS:   Constitutional: Denies fevers, chills or abnormal weight loss Eyes: Denies blurriness of vision Ears, nose, mouth, throat, and face: Denies mucositis or sore throat Respiratory: Denies cough, dyspnea or wheezes Cardiovascular: Denies palpitation, chest discomfort or lower extremity  swelling Gastrointestinal:  Denies nausea, heartburn or change in bowel habits Skin: Denies abnormal skin rashes Lymphatics: Denies new lymphadenopathy or easy bruising Neurological:Denies numbness, tingling or new weaknesses Behavioral/Psych: Mood is stable, no new changes  All other systems were reviewed with the patient and are negative.  MEDICAL HISTORY:  Past Medical History:  Diagnosis Date  . [place]   . Hyperlipidemia   . Osteopenia     SURGICAL HISTORY: Past Surgical History:  Procedure Laterality Date  . BREAST SURGERY  LEFT   LUMPECTOMY  . OOPHRECTOMY     BI LATERERAL FOR CANCER PREVENTON  . PILONIDAL CYST EXCISION      I have reviewed the social history and family history with the patient and they are unchanged from previous note.  ALLERGIES:  has No Known Allergies.  MEDICATIONS:  Current Outpatient Prescriptions  Medication Sig Dispense Refill  . Calcium-Phosphorus-Vitamin D (CALCIUM/D3 ADULT GUMMIES PO) Take 2-3 each by mouth daily. Vitafusion    . simvastatin (ZOCOR) 40 MG tablet TAKE 1 TABLET BY MOUTH EVERY OTHER NIGHT 45 tablet 2   No current facility-administered medications for this visit.     PHYSICAL EXAMINATION: ECOG PERFORMANCE STATUS: 0 - Asymptomatic  Vitals:   02/10/16 1354  BP: 109/64  Pulse: 70  Resp: 18  Temp: 98.2 F (36.8 C)   Filed Weights  02/10/16 1354  Weight: 145 lb 1.6 oz (65.8 kg)    GENERAL:alert, no distress and comfortable SKIN: skin color, texture, turgor are normal, no rashes or significant lesions EYES: normal, Conjunctiva are pink and non-injected, sclera clear OROPHARYNX:no exudate, no erythema and lips, buccal mucosa, and tongue normal  NECK: supple, thyroid normal size, non-tender, without nodularity LYMPH:  no palpable lymphadenopathy in the cervical, axillary or inguinal LUNGS: clear to auscultation and percussion with normal breathing effort HEART: regular rate & rhythm and no murmurs and no  lower extremity edema ABDOMEN:abdomen soft, non-tender and normal bowel sounds Musculoskeletal:no cyanosis of digits and no clubbing  NEURO: alert & oriented x 3 with fluent speech, no focal motor/sensory deficits Breasts: Breast inspection showed them to be symmetrical with no nipple discharge. (+) Implant in the left breast. Palpation of the breasts and axilla revealed no obvious mass that I could appreciate.   LABORATORY DATA:  I have reviewed the data as listed CBC Latest Ref Rng & Units 02/10/2016 02/10/2015 01/29/2014  WBC 3.9 - 10.3 10e3/uL 5.4 5.5 5.9  Hemoglobin 11.6 - 15.9 g/dL 13.7 13.8 13.3  Hematocrit 34.8 - 46.6 % 41.2 40.9 40.1  Platelets 145 - 400 10e3/uL 234 222 213     CMP Latest Ref Rng & Units 09/16/2015 02/10/2015 01/29/2014  Glucose 65 - 99 mg/dL 93 90 84  BUN 7 - 25 mg/dL 10 7.8 12.5  Creatinine 0.50 - 1.05 mg/dL 0.78 0.8 0.8  Sodium 135 - 146 mmol/L 142 143 143  Potassium 3.5 - 5.3 mmol/L 4.0 4.4 4.0  Chloride 98 - 110 mmol/L 106 - -  CO2 20 - 31 mmol/L 26 30(H) 28  Calcium 8.6 - 10.4 mg/dL 8.9 10.0 9.5  Total Protein 6.1 - 8.1 g/dL 6.2 7.3 7.0  Total Bilirubin 0.2 - 1.2 mg/dL 0.5 0.69 0.54  Alkaline Phos 33 - 130 U/L 56 77 66  AST 10 - 35 U/L '21 24 26  ' ALT 6 - 29 U/L '17 19 19      ' RADIOGRAPHIC STUDIES: I have personally reviewed the radiological images as listed and agreed with the findings in the report. No results found.   ASSESSMENT & PLAN:   55 year old Caucasian female,  Postmenopausal after surgery.  1. Breast cancer of upper-outer quadrant of left breast, ER/PR positive, and HER-2 negative , (+) DCIS - she completed 6 years of adjuvant  Arimidex in  2014.  - she is clinically doing very well , physical exam and her last mammogram in May 2016 had no evidence of recurrence - her lab reviewed, CBC is within normal limits, CMP result is still pending  - we discussed that she is 9 years out of her initial diagnosis, the risk of  Cancer recurrence is  much smaller now , also we do sometimes see later recurrence in ER/PR positive breast cancer - she will continue annual mammogram, I encouraged her to do several exam. And she prefers to follow-up with [place] on a yearly basis.  -We discussed the clinical signs of cancer recurrence, and she knows what to watch. -I encouraged her to continue healthy diet and exercise regularly. She is very physically active.   2.   Osteopenia - her bone density got worse when she was on  Arimidex,  She was treated with Fosamax,  Which was held in February 2016 due to her some dental issue - her repeat a bone density scan  In May 2016 showed stable osteopenia, T score -1.2,  Similar  to her bone density scan in 2014 -She was on vitamin D before but stopped due to the extremely high vitamin D level after high dose supplement. Her last vitamin D level has dropped to 35 in 08/2015 I encouraged her to take calcium at least 1 g and vitamin D at least 1000 units daily,  And weight-bearing exercise -She will repeat bone density scan in May 2018  Follow up:  In 1 year with lab. Mammogram and DEXA in 11/2015  All questions were answered. The patient knows to call the clinic with any problems, questions or concerns. No barriers to learning was detected.  I spent 15 minutes counseling the patient face to face. The total time spent in the appointment was 20 minutes and more than 50% was on counseling and review of test results     Truitt Merle, MD 02/10/16 2:14 PM

## 2016-02-10 NOTE — Telephone Encounter (Signed)
Gave pt cal & avs, call to sched mammo & bone scan but I was on hold and the pt did not want to wait, pt said she will call

## 2016-02-16 ENCOUNTER — Other Ambulatory Visit: Payer: Self-pay | Admitting: Hematology

## 2016-02-16 DIAGNOSIS — Z1231 Encounter for screening mammogram for malignant neoplasm of breast: Secondary | ICD-10-CM

## 2016-02-16 DIAGNOSIS — Z853 Personal history of malignant neoplasm of breast: Secondary | ICD-10-CM

## 2016-02-16 DIAGNOSIS — Z9882 Breast implant status: Secondary | ICD-10-CM

## 2016-09-10 ENCOUNTER — Telehealth: Payer: Self-pay | Admitting: Family Medicine

## 2016-09-10 DIAGNOSIS — M8580 Other specified disorders of bone density and structure, unspecified site: Secondary | ICD-10-CM

## 2016-09-10 DIAGNOSIS — E7800 Pure hypercholesterolemia, unspecified: Secondary | ICD-10-CM

## 2016-09-10 NOTE — Telephone Encounter (Signed)
-----   Message from Ellamae Sia sent at 09/07/2016  3:59 PM EST ----- Regarding: Lab orders for Thursday, 3.1.18 Patient is scheduled for CPX labs, please order future labs, Thanks , Karna Christmas

## 2016-09-16 ENCOUNTER — Other Ambulatory Visit (INDEPENDENT_AMBULATORY_CARE_PROVIDER_SITE_OTHER): Payer: BLUE CROSS/BLUE SHIELD

## 2016-09-16 DIAGNOSIS — M8580 Other specified disorders of bone density and structure, unspecified site: Secondary | ICD-10-CM

## 2016-09-16 DIAGNOSIS — E7800 Pure hypercholesterolemia, unspecified: Secondary | ICD-10-CM | POA: Diagnosis not present

## 2016-09-16 LAB — LIPID PANEL
CHOL/HDL RATIO: 3
CHOLESTEROL: 196 mg/dL (ref 0–200)
HDL: 61.5 mg/dL (ref >39.00)
LDL CALC: 119 mg/dL — AB (ref 0–99)
NonHDL: 134.46
Triglycerides: 77 mg/dL (ref 0.0–149.0)
VLDL: 15.4 mg/dL (ref 0.0–40.0)

## 2016-09-16 LAB — COMPREHENSIVE METABOLIC PANEL
ALT: 21 U/L (ref 0–35)
AST: 24 U/L (ref 0–37)
Albumin: 4.1 g/dL (ref 3.5–5.2)
Alkaline Phosphatase: 65 U/L (ref 39–117)
BUN: 10 mg/dL (ref 6–23)
CHLORIDE: 106 meq/L (ref 96–112)
CO2: 30 mEq/L (ref 19–32)
Calcium: 9.2 mg/dL (ref 8.4–10.5)
Creatinine, Ser: 0.7 mg/dL (ref 0.40–1.20)
GFR: 92.57 mL/min (ref >60.00)
Glucose, Bld: 89 mg/dL (ref 70–99)
Potassium: 3.7 mEq/L (ref 3.5–5.1)
SODIUM: 141 meq/L (ref 135–145)
Total Bilirubin: 0.7 mg/dL (ref 0.2–1.2)
Total Protein: 7.1 g/dL (ref 6.0–8.3)

## 2016-09-16 LAB — VITAMIN D 25 HYDROXY (VIT D DEFICIENCY, FRACTURES): VITD: 26.57 ng/mL — ABNORMAL LOW (ref 30.00–100.00)

## 2016-09-23 ENCOUNTER — Ambulatory Visit (INDEPENDENT_AMBULATORY_CARE_PROVIDER_SITE_OTHER): Payer: BLUE CROSS/BLUE SHIELD | Admitting: Family Medicine

## 2016-09-23 ENCOUNTER — Encounter: Payer: Self-pay | Admitting: Family Medicine

## 2016-09-23 VITALS — BP 112/78 | HR 80 | Temp 97.8°F | Ht 62.0 in | Wt 146.0 lb

## 2016-09-23 DIAGNOSIS — M8580 Other specified disorders of bone density and structure, unspecified site: Secondary | ICD-10-CM

## 2016-09-23 DIAGNOSIS — Z0000 Encounter for general adult medical examination without abnormal findings: Secondary | ICD-10-CM | POA: Diagnosis not present

## 2016-09-23 DIAGNOSIS — Z853 Personal history of malignant neoplasm of breast: Secondary | ICD-10-CM

## 2016-09-23 DIAGNOSIS — E7800 Pure hypercholesterolemia, unspecified: Secondary | ICD-10-CM

## 2016-09-23 NOTE — Assessment & Plan Note (Signed)
Followed by ONC Dr. Burr Medico.

## 2016-09-23 NOTE — Patient Instructions (Addendum)
Work on taking simvastatin every other day. Keep up with exercise and work on low cholesterol diet.

## 2016-09-23 NOTE — Assessment & Plan Note (Signed)
Due for re-eval this year. Scheduled.

## 2016-09-23 NOTE — Progress Notes (Signed)
Subjective:    Patient ID: [name], female    DOB: [birth year], 56 y.o.   MRN: [id number]  HPI   The patient is here for annual wellness exam and preventative care.    Elevated Cholesterol:  On moderate dose  simvastatin 40 mg every other day  Father with heart disease early age. Lab Results  Component Value Date   CHOL 196 09/16/2016   HDL 61.50 09/16/2016   LDLCALC 119 (H) 09/16/2016   LDLDIRECT 168.3 07/15/2008   TRIG 77.0 09/16/2016   CHOLHDL 3 09/16/2016  Using medications without problems: Muscle aches:  Diet compliance: Good Exercise: walking on treadmill 40-50 min 4 times a week. 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  Dr Burr Medico...sees her yearly Dx in 2008. Completed arimidex.  Has history of BSO due to risk of ovarian cancer. Still has uterus.   vit D:  Low. Using  OTC chewable vit D 400 IU daily  Social History /Family History/Past Medical History reviewed and updated if needed. Blood pressure 112/78, pulse 80, temperature 97.8 F (36.6 C), temperature source Oral, height 5\' 2"  (1.575 m), weight 146 lb (66.2 kg).  Review of Systems  Constitutional: Negative for fatigue and fever.  HENT: Negative for congestion.   Eyes: Negative for pain.  Respiratory: Negative for cough and shortness of breath.   Cardiovascular: Negative for chest pain, palpitations and leg swelling.  Gastrointestinal: Negative for abdominal pain.  Genitourinary: Negative for dysuria and vaginal bleeding.  Musculoskeletal: Negative for back pain.  Neurological: Negative for syncope, light-headedness and headaches.  Psychiatric/Behavioral: Negative for dysphoric mood.       Objective:   Physical Exam  Constitutional: Vital signs are normal. She appears well-developed and well-nourished. She is cooperative.  Non-toxic appearance. She does not appear ill. No distress.  HENT:  Head: Normocephalic.  Right Ear: Hearing, tympanic  membrane, external ear and ear canal normal.  Left Ear: Hearing, tympanic membrane, external ear and ear canal normal.  Nose: Nose normal.  Eyes: Conjunctivae, EOM and lids are normal. Pupils are equal, round, and reactive to light. Lids are everted and swept, no foreign bodies found.  Neck: Trachea normal and normal range of motion. Neck supple. Carotid bruit is not present. No thyroid mass and no thyromegaly present.  Cardiovascular: Normal rate, regular rhythm, S1 normal, S2 normal, normal heart sounds and intact distal pulses.  Exam reveals no gallop.   No murmur heard. Pulmonary/Chest: Effort normal and breath sounds normal. No respiratory distress. She has no wheezes. She has no rhonchi. She has no rales.  Abdominal: Soft. Normal appearance and bowel sounds are normal. She exhibits no distension, no fluid wave, no abdominal bruit and no mass. There is no hepatosplenomegaly. There is no tenderness. There is no rebound, no guarding and no CVA tenderness. No hernia.  Genitourinary: No breast swelling, tenderness, discharge or bleeding. Pelvic exam was performed with patient supine.  Lymphadenopathy:    She has no cervical adenopathy.    She has no axillary adenopathy.  Neurological: She is alert. She has normal strength. No cranial nerve deficit or sensory deficit.  Skin: Skin is warm, dry and intact. No rash noted.  Psychiatric: Her speech is normal and behavior is normal. Judgment normal. Her mood appears not anxious. Cognition and memory are normal. She does not exhibit a depressed mood.          Assessment & Plan:  The patient's preventative maintenance and recommended screening  tests for an annual wellness exam were reviewed in full today. Brought up to date unless services declined.  Counselled on the importance of diet, exercise, and its role in overall health and mortality. The patient's FH and SH was reviewed, including their home life, tobacco status, and drug and alcohol  status.   Continue DVE yearly given uterus still present. S/P BSO, not indicated PAPs every 5 years, last nml 2015, neg HPV  Breast exam today  Mammo: per ONC Dr. Burr Medico plans in 11/2015. DEXA: 10/2012: osteopenia (but was high risk since on Arimidex, now off arimidex for 3 years, she was on fosamax ( was on for almost 2 years) to prevent deterioration, but has stopped now) Stable DEXA in 11/2014, plan repeat in 11/2016. Vaccines: uptodate with tetanus. Refused flu. Refused HIV screen.  Hep C screen: done

## 2016-09-23 NOTE — Assessment & Plan Note (Signed)
Slight worsening of chol levels on loess frequent statin. Try to take at least every other day.

## 2016-09-23 NOTE — Progress Notes (Signed)
Pre visit review using our clinic review tool, if applicable. No additional management support is needed unless otherwise documented below in the visit note. 

## 2016-11-23 ENCOUNTER — Ambulatory Visit
Admission: RE | Admit: 2016-11-23 | Discharge: 2016-11-23 | Disposition: A | Discharge status: Home / Self Care | Payer: BLUE CROSS/BLUE SHIELD | Source: Ambulatory Visit | Attending: Hematology | Admitting: Hematology

## 2016-11-23 DIAGNOSIS — Z1231 Encounter for screening mammogram for malignant neoplasm of breast: Secondary | ICD-10-CM

## 2016-11-23 DIAGNOSIS — Z9882 Breast implant status: Secondary | ICD-10-CM

## 2016-11-23 DIAGNOSIS — M8580 Other specified disorders of bone density and structure, unspecified site: Secondary | ICD-10-CM

## 2016-11-23 DIAGNOSIS — Z853 Personal history of malignant neoplasm of breast: Secondary | ICD-10-CM

## 2016-11-23 HISTORY — DX: Personal history of irradiation: Z92.3

## 2016-12-22 ENCOUNTER — Other Ambulatory Visit: Payer: Self-pay | Admitting: Family Medicine

## 2017-02-08 NOTE — Progress Notes (Signed)
[place]  Telephone:([phone]) [phone] Fax:([phone]) [phone]  Clinic Follow up Note   Patient Care Team: Jinny Sanders, MD as PCP - General 02/10/2017  CHIEF COMPLAIN: f/u left breast cancer   SUMMARY OF ONCOLOGIC HISTORY: Oncology History   Breast cancer of upper-outer quadrant of left female breast   Staging form: Breast, AJCC 7th Edition     Pathologic: Stage IA (T1b, N0, cM0) - Unsigned        History of breast cancer, left   05/2006 Initial Diagnosis    Breast cancer of upper-outer quadrant of left female breast      05/2006 Initial Biopsy     left breast mass biopsy showed invasive ductal carcinoma, the biopsy was done in [facility]      05/2006 Receptors her2    ER positive, PR positive, HER-2 negative      05/2006 Imaging    Mammogram and ultrasound showed a 8 mm mass in the left breast 10:00 position      10/11/2006 Pathology Results     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 0 cm, 2 sentinel lymph nodes negative, (+)  DCIS      10/11/2006 Surgery    Left lumpectomy with negative margins.      11/22/2006 - 12/24/2006 Radiation Therapy     left breast irradiation, under Dr. [name]       02/2007 Surgery     bilateral salpingo-oophorectomy       03/2007 - 12/2012 Anti-estrogen oral therapy    adjuvant Arimidex '1mg'$  daily        CURRENT THERAPY: Surveillance  INTERVAL HISTORY:  Ms [name]  Returns for annual follow-up. She was last seen by me 1 year ago. She presents to the clinic today with her family member. She reports nothing new. She no longer has joint stiffness since stopping Anastrozole. She denies stomach or breathing issues.   She was on Fosamax for 1.5 years but stopped when she had teeth surgery and did not go back. She recently fell during her yard work she is not in pain.  She does self breast exams.    REVIEW OF SYSTEMS:   Constitutional: Denies fevers, chills or abnormal weight loss Eyes: Denies  blurriness of vision Ears, nose, mouth, throat, and face: Denies mucositis or sore throat Respiratory: Denies cough, dyspnea or wheezes Cardiovascular: Denies palpitation, chest discomfort or lower extremity swelling Gastrointestinal:  Denies nausea, heartburn or change in bowel habits Skin: Denies abnormal skin rashes Lymphatics: Denies new lymphadenopathy or easy bruising Neurological:Denies numbness, tingling or new weaknesses Behavioral/Psych: Mood is stable, no new changes  All other systems were reviewed with the patient and are negative.  MEDICAL HISTORY:  Past Medical History:  Diagnosis Date  . Breast cancer ([place])   . Hyperlipidemia   . Osteopenia   . Personal history of radiation therapy 2008    SURGICAL HISTORY: Past Surgical History:  Procedure Laterality Date  . AUGMENTATION MAMMAPLASTY Bilateral 2001  . BREAST LUMPECTOMY Left 2008  . BREAST SURGERY  LEFT   LUMPECTOMY  . OOPHRECTOMY     BI LATERERAL FOR CANCER PREVENTON  . PILONIDAL CYST EXCISION      I have reviewed the social history and family history with the patient and they are unchanged from previous note.  ALLERGIES:  has No Known Allergies.  MEDICATIONS:  Current Outpatient Prescriptions  Medication Sig Dispense Refill  . Calcium-Phosphorus-Vitamin D (CALCIUM/D3 ADULT GUMMIES PO) Take 2-3 each by mouth daily.  Vitafusion    . simvastatin (ZOCOR) 40 MG tablet TAKE 1 TABLET BY MOUTH EVERY OTHER NIGHT 45 tablet 2   No current facility-administered medications for this visit.     PHYSICAL EXAMINATION: ECOG PERFORMANCE STATUS: 0 - Asymptomatic  Vitals:   02/10/17 1347  BP: 126/74  Pulse: 72  Resp: 18  Temp: 98.5 F (36.9 C)   Filed Weights   02/10/17 1347  Weight: 148 lb 9.6 oz (67.4 kg)    GENERAL:alert, no distress and comfortable SKIN: skin color, texture, turgor are normal, no rashes or significant lesions EYES: normal, Conjunctiva are pink and non-injected, sclera  clear OROPHARYNX:no exudate, no erythema and lips, buccal mucosa, and tongue normal  NECK: supple, thyroid normal size, non-tender, without nodularity LYMPH:  no palpable lymphadenopathy in the cervical, axillary or inguinal LUNGS: clear to auscultation and percussion with normal breathing effort HEART: regular rate & rhythm and no murmurs and no lower extremity edema ABDOMEN:abdomen soft, non-tender and normal bowel sounds Musculoskeletal:no cyanosis of digits and no clubbing  NEURO: alert & oriented x 3 with fluent speech, no focal motor/sensory deficits Breasts: Breast inspection showed them to be symmetrical with no nipple discharge. (+) Implant in the left breast. Palpation of the breasts and axilla revealed no obvious mass that I could appreciate.   LABORATORY DATA:  I have reviewed the data as listed CBC Latest Ref Rng & Units 02/10/2017 02/10/2016 02/10/2015  WBC 3.9 - 10.3 10e3/uL 6.0 5.4 5.5  Hemoglobin 11.6 - 15.9 g/dL 14.1 13.7 13.8  Hematocrit 34.8 - 46.6 % 41.5 41.2 40.9  Platelets 145 - 400 10e3/uL 237 234 222     CMP Latest Ref Rng & Units 02/10/2017 09/16/2016 02/10/2016  Glucose 70 - 140 mg/dl 81 89 85  BUN 7.0 - 26.0 mg/dL 11.7 10 9.8  Creatinine 0.6 - 1.1 mg/dL 0.8 0.70 0.8  Sodium 136 - 145 mEq/L 142 141 141  Potassium 3.5 - 5.1 mEq/L 3.5 3.7 4.2  Chloride 96 - 112 mEq/L - 106 -  CO2 22 - 29 mEq/L _0 Calcium 8.4 - 10.4 mg/dL 9.4 9.2 9.4  Total Protein 6.4 - 8.3 g/dL 7.2 7.1 7.3  Total Bilirubin 0.20 - 1.20 mg/dL 0.62 0.7 0.62  Alkaline Phos 40 - 150 U/L 81 65 74  AST 5 - 34 U/L _1 ALT 0 - 55 U/L _2 RADIOGRAPHIC STUDIES: I have personally reviewed the radiological images as listed and agreed with the findings in the report. No results found.    Bone Density 11/23/16 ASSESSMENT: The BMD measured at Femur Neck Left is 0.913 g/cm2 with a T-score of -0.9. This patient is considered normal according to [facility])  criteria. There has been a statistically significant increase in BMD of Lumbar spine, and no statistically significant change in BMD of Left hip since prior exam dated 11/18/2014.  Mammogram screening W/ Implant 11/23/16 IMPRESSION: No mammographic evidence of malignancy. A result letter of this screening mammogram will be mailed directly to the patient. RECOMMENDATION: Screening mammogram in one year.  ASSESSMENT & PLAN:  56 y.o. Caucasian female,  Postmenopausal.  1. History of breast cancer of upper-outer quadrant of left breast, ER/PR positive, and HER-2 negative , (+) DCIS - she completed 6 years of adjuvant  Arimidex in  2014.  - we discussed that she is 9 years out of her initial diagnosis, the risk of  Cancer recurrence is much smaller  now , although we do sometimes see later recurrence in ER/PR positive breast cancer - she will continue annual mammogram, I encouraged her to do several exam. And she prefers to follow-up with [place] on a yearly basis.  -We discussed the clinical signs of cancer recurrence, and she knows what to watch. -I encouraged her to continue healthy diet and exercise regularly. She is very physically active.  -Labs reviewed and her previous vitamin D was slightly low. I recommend her to take additional vitamin D. CBC and CMP normal, 11/2016 mammogram normal and breast exam normal, no evidence of recurrence.  -Continue breast cancer surveillance and annual visit.  2.   Osteopenia - her bone density got worse when she was on  Arimidex,  She was treated with Fosamax,  Which was held in February 2016 due to her some dental issue, she was on Fosamax for 1.5 years  - her repeat a bone density scan  In May 2016 showed stable osteopenia, T score -1.2,  Similar to her bone density scan in 2014 -Repeated the bone density scan in May 2018 showed normal bone density, T score -0.9, improved  -She was on vitamin D before but stopped due to the extremely high vitamin D level  after high dose supplement. Her last vitamin D level has dropped to 35 in 08/2015 I encouraged her to take calcium at least 1 g and vitamin D at least 1000 units daily,  And weight-bearing exercise   Follow up: In 1 year with lab. Mammogram in 11/2016   All questions were answered. The patient knows to call the clinic with any problems, questions or concerns. No barriers to learning was detected.  I spent 15 minutes counseling the patient face to face. The total time spent in the appointment was 20 minutes and more than 50% was on counseling and review of test results  This document serves as a record of services personally performed by Truitt Merle, MD. It was created on her behalf by Joslyn Devon, a trained medical scribe. The creation of this record is based on the scribe's personal observations and the provider's statements to them. This document has been checked and approved by the attending provider.     Truitt Merle, MD 02/10/2017

## 2017-02-10 ENCOUNTER — Telehealth: Payer: Self-pay | Admitting: Hematology

## 2017-02-10 ENCOUNTER — Encounter: Payer: Self-pay | Admitting: Hematology

## 2017-02-10 ENCOUNTER — Other Ambulatory Visit (HOSPITAL_BASED_OUTPATIENT_CLINIC_OR_DEPARTMENT_OTHER): Payer: BLUE CROSS/BLUE SHIELD

## 2017-02-10 ENCOUNTER — Ambulatory Visit (HOSPITAL_BASED_OUTPATIENT_CLINIC_OR_DEPARTMENT_OTHER): Payer: BLUE CROSS/BLUE SHIELD | Admitting: Hematology

## 2017-02-10 VITALS — BP 126/74 | HR 72 | Temp 98.5°F | Resp 18 | Ht 62.0 in | Wt 148.6 lb

## 2017-02-10 DIAGNOSIS — Z853 Personal history of malignant neoplasm of breast: Secondary | ICD-10-CM

## 2017-02-10 DIAGNOSIS — M8580 Other specified disorders of bone density and structure, unspecified site: Secondary | ICD-10-CM

## 2017-02-10 DIAGNOSIS — C50412 Malignant neoplasm of upper-outer quadrant of left female breast: Secondary | ICD-10-CM

## 2017-02-10 LAB — COMPREHENSIVE METABOLIC PANEL
ALT: 21 U/L (ref 0–55)
AST: 24 U/L (ref 5–34)
Albumin: 3.9 g/dL (ref 3.5–5.0)
Alkaline Phosphatase: 81 U/L (ref 40–150)
Anion Gap: 10 mEq/L (ref 3–11)
BUN: 11.7 mg/dL (ref 7.0–26.0)
CO2: 25 mEq/L (ref 22–29)
CREATININE: 0.8 mg/dL (ref 0.6–1.1)
Calcium: 9.4 mg/dL (ref 8.4–10.4)
Chloride: 107 mEq/L (ref 98–109)
EGFR: 83 mL/min/{1.73_m2} — ABNORMAL LOW (ref >90)
Glucose: 81 mg/dl (ref 70–140)
Potassium: 3.5 mEq/L (ref 3.5–5.1)
Sodium: 142 mEq/L (ref 136–145)
Total Bilirubin: 0.62 mg/dL (ref 0.20–1.20)
Total Protein: 7.2 g/dL (ref 6.4–8.3)

## 2017-02-10 LAB — CBC WITH DIFFERENTIAL/PLATELET
BASO%: 0.5 % (ref 0.0–2.0)
Basophils Absolute: 0 10*3/uL (ref 0.0–0.1)
EOS%: 0.5 % (ref 0.0–7.0)
Eosinophils Absolute: 0 10*3/uL (ref 0.0–0.5)
HEMATOCRIT: 41.5 % (ref 34.8–46.6)
HGB: 14.1 g/dL (ref 11.6–15.9)
LYMPH#: 1.5 10*3/uL (ref 0.9–3.3)
LYMPH%: 25.1 % (ref 14.0–49.7)
MCH: 29.2 pg (ref 25.1–34.0)
MCHC: 34 g/dL (ref 31.5–36.0)
MCV: 85.7 fL (ref 79.5–101.0)
MONO#: 0.4 10*3/uL (ref 0.1–0.9)
MONO%: 6.4 % (ref 0.0–14.0)
NEUT%: 67.5 % (ref 38.4–76.8)
NEUTROS ABS: 4 10*3/uL (ref 1.5–6.5)
PLATELETS: 237 10*3/uL (ref 145–400)
RBC: 4.84 10*6/uL (ref 3.70–5.45)
RDW: 13.8 % (ref 11.2–14.5)
WBC: 6 10*3/uL (ref 3.9–10.3)

## 2017-02-10 NOTE — Telephone Encounter (Signed)
Gave patient avs report and appointments for July 2019. [facility]

## 2017-08-23 ENCOUNTER — Telehealth: Payer: Self-pay | Admitting: Family Medicine

## 2017-08-23 DIAGNOSIS — M8580 Other specified disorders of bone density and structure, unspecified site: Secondary | ICD-10-CM

## 2017-08-23 DIAGNOSIS — E7800 Pure hypercholesterolemia, unspecified: Secondary | ICD-10-CM

## 2017-08-23 NOTE — Telephone Encounter (Signed)
-----   Message from Ellamae Sia sent at 08/17/2017  8:35 AM EST ----- Regarding: Lab orders for Tuesday, 2.5.19 Patient is scheduled for CPX labs, please order future labs, Thanks , Karna Christmas

## 2017-08-30 ENCOUNTER — Other Ambulatory Visit: Payer: Self-pay | Admitting: Hematology

## 2017-08-30 DIAGNOSIS — Z1231 Encounter for screening mammogram for malignant neoplasm of breast: Secondary | ICD-10-CM

## 2017-09-20 ENCOUNTER — Other Ambulatory Visit (INDEPENDENT_AMBULATORY_CARE_PROVIDER_SITE_OTHER): Payer: BLUE CROSS/BLUE SHIELD

## 2017-09-20 DIAGNOSIS — M8580 Other specified disorders of bone density and structure, unspecified site: Secondary | ICD-10-CM

## 2017-09-20 DIAGNOSIS — E7800 Pure hypercholesterolemia, unspecified: Secondary | ICD-10-CM | POA: Diagnosis not present

## 2017-09-20 LAB — LIPID PANEL
CHOL/HDL RATIO: 4
Cholesterol: 189 mg/dL (ref 0–200)
HDL: 52.6 mg/dL (ref >39.00)
LDL CALC: 119 mg/dL — AB (ref 0–99)
NonHDL: 136.2
TRIGLYCERIDES: 88 mg/dL (ref 0.0–149.0)
VLDL: 17.6 mg/dL (ref 0.0–40.0)

## 2017-09-20 LAB — COMPREHENSIVE METABOLIC PANEL
ALBUMIN: 4 g/dL (ref 3.5–5.2)
ALT: 13 U/L (ref 0–35)
AST: 18 U/L (ref 0–37)
Alkaline Phosphatase: 62 U/L (ref 39–117)
BILIRUBIN TOTAL: 0.7 mg/dL (ref 0.2–1.2)
BUN: 9 mg/dL (ref 6–23)
CALCIUM: 9.4 mg/dL (ref 8.4–10.5)
CHLORIDE: 105 meq/L (ref 96–112)
CO2: 30 meq/L (ref 19–32)
CREATININE: 0.74 mg/dL (ref 0.40–1.20)
GFR: 86.49 mL/min (ref >60.00)
Glucose, Bld: 91 mg/dL (ref 70–99)
Potassium: 3.8 mEq/L (ref 3.5–5.1)
SODIUM: 140 meq/L (ref 135–145)
Total Protein: 7.1 g/dL (ref 6.0–8.3)

## 2017-09-20 LAB — VITAMIN D 25 HYDROXY (VIT D DEFICIENCY, FRACTURES): VITD: 29.29 ng/mL — ABNORMAL LOW (ref 30.00–100.00)

## 2017-09-27 ENCOUNTER — Ambulatory Visit (INDEPENDENT_AMBULATORY_CARE_PROVIDER_SITE_OTHER): Payer: BLUE CROSS/BLUE SHIELD | Admitting: Family Medicine

## 2017-09-27 ENCOUNTER — Other Ambulatory Visit: Payer: Self-pay

## 2017-09-27 ENCOUNTER — Encounter: Payer: Self-pay | Admitting: Family Medicine

## 2017-09-27 VITALS — BP 100/76 | HR 63 | Temp 98.3°F | Ht 62.0 in | Wt 148.0 lb

## 2017-09-27 DIAGNOSIS — E7800 Pure hypercholesterolemia, unspecified: Secondary | ICD-10-CM

## 2017-09-27 DIAGNOSIS — Z0000 Encounter for general adult medical examination without abnormal findings: Secondary | ICD-10-CM

## 2017-09-27 NOTE — Assessment & Plan Note (Signed)
Using simvastatin off and on.  Work on low cholesterol diet and increased exercise.

## 2017-09-27 NOTE — Patient Instructions (Addendum)
Make sure taking vit d 400 IU twice daily.  Work on low cholesterol diet. increase exercise.

## 2017-09-27 NOTE — Progress Notes (Signed)
Subjective:    Patient ID: [name], female    DOB: [birth year], 57 y.o.   MRN: [id number]  HPI  The patient is here for annual wellness exam and preventative care.    Elevated Cholesterol:  Occ simvastatin  40 mg Lab Results  Component Value Date   CHOL 189 09/20/2017   HDL 52.60 09/20/2017   LDLCALC 119 (H) 09/20/2017   LDLDIRECT 168.3 07/15/2008   TRIG 88.0 09/20/2017   CHOLHDL 4 09/20/2017  Using medications without problems: Muscle aches:  Diet compliance: healthy Exercise: treadmill 2-3 times a week. 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  Dr Burr Medico...sees her yearly Dx in 2008. Completed arimidex.  Has history of BSO due to risk of ovarian cancer. Still has uterus.  Social History /Family History/Past Medical History reviewed in detail and updated in EMR if needed. Blood pressure 100/76, pulse 63, temperature 98.3 F (36.8 C), temperature source Oral, height 5\' 2"  (1.575 m), weight 148 lb (67.1 kg).  Review of Systems  Constitutional: Negative for fatigue and fever.  HENT: Negative for congestion.   Eyes: Negative for pain.  Respiratory: Negative for cough and shortness of breath.   Cardiovascular: Negative for chest pain, palpitations and leg swelling.  Gastrointestinal: Negative for abdominal pain.  Genitourinary: Negative for dysuria and vaginal bleeding.  Musculoskeletal: Negative for back pain.  Neurological: Negative for syncope, light-headedness and headaches.  Psychiatric/Behavioral: Negative for dysphoric mood.       Objective:   Physical Exam  Constitutional: Vital signs are normal. She appears well-developed and well-nourished. She is cooperative.  Non-toxic appearance. She does not appear ill. No distress.  HENT:  Head: Normocephalic.  Right Ear: Hearing, tympanic membrane, external ear and ear canal normal.  Left Ear: Hearing, tympanic membrane, external ear and ear canal normal.    Nose: Nose normal.  Eyes: Conjunctivae, EOM and lids are normal. Pupils are equal, round, and reactive to light. Lids are everted and swept, no foreign bodies found.  Neck: Trachea normal and normal range of motion. Neck supple. Carotid bruit is not present. No thyroid mass and no thyromegaly present.  Cardiovascular: Normal rate, regular rhythm, S1 normal, S2 normal, normal heart sounds and intact distal pulses. Exam reveals no gallop.  No murmur heard. Pulmonary/Chest: Effort normal and breath sounds normal. No respiratory distress. She has no wheezes. She has no rhonchi. She has no rales.  Abdominal: Soft. Normal appearance and bowel sounds are normal. She exhibits no distension, no fluid wave, no abdominal bruit and no mass. There is no hepatosplenomegaly. There is no tenderness. There is no rebound, no guarding and no CVA tenderness. No hernia.  Genitourinary: Vagina normal and uterus normal. No breast swelling, tenderness, discharge or bleeding. Pelvic exam was performed with patient supine. There is no rash, tenderness or lesion on the right labia. There is no rash, tenderness or lesion on the left labia. Uterus is not enlarged and not tender. Cervix exhibits no motion tenderness, no discharge and no friability. Right adnexum displays no mass, no tenderness and no fullness. Left adnexum displays no mass, no tenderness and no fullness.  Lymphadenopathy:    She has no cervical adenopathy.    She has no axillary adenopathy.  Neurological: She is alert. She has normal strength. No cranial nerve deficit or sensory deficit.  Skin: Skin is warm, dry and intact. No rash noted.  Psychiatric: Her speech is normal and behavior is normal. Judgment normal. Her mood  appears not anxious. Cognition and memory are normal. She does not exhibit a depressed mood.          Assessment & Plan:  The patient's preventative maintenance and recommended screening tests for an annual wellness exam were reviewed in  full today. Brought up to date unless services declined.  Counselled on the importance of diet, exercise, and its role in overall health and mortality. The patient's FH and SH was reviewed, including their home life, tobacco status, and drug and alcohol status.   Continue DVE yearly given uterus still present. S/P BSO,  Ovary eval not indicated. Has cervix PAPs every 5 years, last nml 2015, neg HPV   Mammo: per ONC Dr. Burr Medico plans in 11/2016. DEXA: 10/2012: osteopenia (but was high risk since on Arimidex, now off arimidex for 3 years, she was on fosamax ( was on for almost 2 years) to prevent deterioration, but has stopped now) Stable DEXA in   5/ 2016 osteopenia, and nml in 11/2016. Vaccines: uptodate with tetanus. Refused flu. Refused HIV screen. Hep C screen: done

## 2017-11-28 ENCOUNTER — Ambulatory Visit
Admission: RE | Admit: 2017-11-28 | Discharge: 2017-11-28 | Disposition: A | Discharge status: Home / Self Care | Payer: BLUE CROSS/BLUE SHIELD | Source: Ambulatory Visit | Attending: Hematology | Admitting: Hematology

## 2017-11-28 DIAGNOSIS — Z1231 Encounter for screening mammogram for malignant neoplasm of breast: Secondary | ICD-10-CM

## 2017-12-25 ENCOUNTER — Other Ambulatory Visit: Payer: Self-pay | Admitting: Family Medicine

## 2017-12-26 ENCOUNTER — Other Ambulatory Visit: Payer: Self-pay | Admitting: Hematology

## 2017-12-28 ENCOUNTER — Telehealth: Payer: Self-pay | Admitting: Hematology

## 2017-12-28 NOTE — Telephone Encounter (Signed)
Spoke with patient regarding scheduled change from 7/26 to 8/8.  Patient asked for calendar; calendar mailed.

## 2018-02-10 ENCOUNTER — Other Ambulatory Visit: Payer: BLUE CROSS/BLUE SHIELD

## 2018-02-10 ENCOUNTER — Ambulatory Visit: Payer: BLUE CROSS/BLUE SHIELD | Admitting: Hematology

## 2018-02-20 NOTE — Progress Notes (Signed)
[name]  [phone] [phone]  Clinic Follow up Note   Patient Care Team: Jinny Sanders, MD as PCP - General 02/23/2018  CHIEF COMPLAIN: f/u left breast cancer   SUMMARY OF ONCOLOGIC HISTORY: Oncology History   Breast cancer of upper-outer quadrant of left female breast   Staging form: Breast, AJCC 7th Edition     Pathologic: Stage IA (T1b, N0, cM0) - Unsigned        History of breast cancer, left   05/2006 Initial Diagnosis    Breast cancer of upper-outer quadrant of left female breast    05/2006 Initial Biopsy     left breast mass biopsy showed invasive ductal carcinoma, the biopsy was done in [facility]    05/2006 Receptors her2    ER positive, PR positive, HER-2 negative    05/2006 Imaging    Mammogram and ultrasound showed a 8 mm mass in the left breast 10:00 position    10/11/2006 Pathology Results     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 0 cm, 2 sentinel lymph nodes negative, (+)  DCIS    10/11/2006 Surgery    Left lumpectomy with negative margins.    11/22/2006 - 12/24/2006 Radiation Therapy     left breast irradiation, under Dr. [name]     02/2007 Surgery     bilateral salpingo-oophorectomy     03/2007 - 12/2012 Anti-estrogen oral therapy    adjuvant Arimidex 76m daily     11/28/2017 Mammogram    11/28/2017 Mammogram IMPRESSION: No mammographic evidence of malignancy. A result letter of this screening mammogram will be mailed directly to the patient.     CURRENT THERAPY: Surveillance  INTERVAL HISTORY: Tara CWedinreturns for annual follow-up. She is here with her mother. She is feeling good. She complains of back pain and stiffness in the morning when she wakes up. It improves with movement. She has history of arthritis. She tries to exercise  regularly and stay active.  She follows up with her PCP annually.   REVIEW OF SYSTEMS:  Constitutional: Denies fevers, chills or abnormal  weight loss Eyes: Denies blurriness of vision Ears, nose, mouth, throat, and face: Denies mucositis or sore throat Respiratory: Denies cough, dyspnea or wheezes Cardiovascular: Denies palpitation, chest discomfort or lower extremity swelling Gastrointestinal:  Denies nausea, heartburn or change in bowel habits Skin: Denies abnormal skin rashes Lymphatics: Denies new lymphadenopathy or easy bruising Neurological:Denies numbness, tingling or new weaknesses MSK: (+) back pain and stiffness in the morning Behavioral/Psych: Mood is stable, no new changes  All other systems were reviewed with the patient and are negative.  MEDICAL HISTORY:  Past Medical History:  Diagnosis Date  . [place]   . Hyperlipidemia   . Osteopenia   . Personal history of radiation therapy 2008    SURGICAL HISTORY: Past Surgical History:  Procedure Laterality Date  . AUGMENTATION MAMMAPLASTY Bilateral 2001  . BREAST LUMPECTOMY Left 2008  . BREAST SURGERY  LEFT   LUMPECTOMY  . OOPHRECTOMY     BI LATERERAL FOR CANCER PREVENTON  . PILONIDAL CYST EXCISION      [name] have reviewed the social history and family history with the patient and they are unchanged from previous note.  ALLERGIES:  has No Known Allergies.  MEDICATIONS:  Current Outpatient Medications  Medication Sig Dispense Refill  . Calcium-Phosphorus-Vitamin D (CALCIUM/D3 ADULT GUMMIES PO) Take 2-3 each by mouth daily. Vitafusion    . simvastatin (ZOCOR) 40 MG tablet  TAKE 1 TABLET BY MOUTH EVERY OTHER NIGHT 45 tablet 2  . SOOLANTRA 1 % CREA      No current facility-administered medications for this visit.     PHYSICAL EXAMINATION: ECOG PERFORMANCE STATUS: 0 - Asymptomatic  Vitals:   02/23/18 1519  BP: 133/75  Pulse: 65  Resp: 18  Temp: 98.5 F (36.9 C)  SpO2: 98%   Filed Weights   02/23/18 1519  Weight: 148 lb 9.6 oz (67.4 kg)    GENERAL:alert, no distress and comfortable SKIN: skin color, texture, turgor are normal, no  rashes or significant lesions EYES: normal, Conjunctiva are pink and non-injected, sclera clear OROPHARYNX:no exudate, no erythema and lips, buccal mucosa, and tongue normal  NECK: supple, thyroid normal size, non-tender, without nodularity LYMPH:  no palpable lymphadenopathy in the cervical, axillary or inguinal LUNGS: clear to auscultation and percussion with normal breathing effort HEART: regular rate & rhythm and no murmurs and no lower extremity edema ABDOMEN:abdomen soft, non-tender and normal bowel sounds Musculoskeletal:no cyanosis of digits and no clubbing (+) lower back pain NEURO: alert & oriented x 3 with fluent speech, no focal motor/sensory deficits Breasts: Breast inspection showed them to be symmetrical with no nipple discharge. (+) Implant in the left breast. Palpation of the breasts and axilla revealed no obvious mass that [name] could appreciate.(+) numbness at site of incision    LABORATORY DATA:  [name] have reviewed the data as listed CBC Latest Ref Rng & Units 02/23/2018 02/10/2017 02/10/2016  WBC 3.9 - 10.3 K/uL 6.2 6.0 5.4  Hemoglobin 11.6 - 15.9 g/dL 12.9 14.1 13.7  Hematocrit 34.8 - 46.6 % 38.8 41.5 41.2  Platelets 145 - 400 K/uL 260 237 234     CMP Latest Ref Rng & Units 02/23/2018 09/20/2017 02/10/2017  Glucose 70 - 99 mg/dL 80 91 81  BUN 6 - 20 mg/dL 13 9 11.7  Creatinine 0.44 - 1.00 mg/dL 0.78 0.74 0.8  Sodium 135 - 145 mmol/L 143 140 142  Potassium 3.5 - 5.1 mmol/L 3.9 3.8 3.5  Chloride 98 - 111 mmol/L 107 105 -  CO2 22 - 32 mmol/L _0 Calcium 8.9 - 10.3 mg/dL 9.1 9.4 9.4  Total Protein 6.5 - 8.1 g/dL 7.1 7.1 7.2  Total Bilirubin 0.3 - 1.2 mg/dL 0.5 0.7 0.62  Alkaline Phos 38 - 126 U/L 81 62 81  AST 15 - 41 U/L _1 ALT 0 - 44 U/L _2 RADIOGRAPHIC STUDIES: [name] have personally reviewed the radiological images as listed and agreed with the findings in the report.   11/28/2017 Mammogram IMPRESSION: No mammographic evidence of malignancy. A result  letter of this screening mammogram will be mailed directly to the patient.  Bone Density 11/23/16 ASSESSMENT: The BMD measured at Femur Neck Left is 0.913 g/cm2 with a T-score of -0.9. This patient is considered normal according to [facility]) criteria. There has been a statistically significant increase in BMD of Lumbar spine, and no statistically significant change in BMD of Left hip since prior exam dated 11/18/2014.  Mammogram screening W/ Implant 11/23/16 IMPRESSION: No mammographic evidence of malignancy. A result letter of this screening mammogram will be mailed directly to the patient. RECOMMENDATION: Screening mammogram in one year.  ASSESSMENT & PLAN:  57 y.o. Caucasian female,  Postmenopausal.  1. History of breast cancer of upper-outer quadrant of left breast, stage IA, ER/PR positive, and HER-2 negative , (+) DCIS - she completed 6 years  of adjuvant  Arimidex in  2014.  - we discussed that she is more than 10 years out of her initial diagnosis, the risk of  Cancer recurrence is much smaller now , although we do sometimes see later recurrence in ER/PR positive breast cancer - she will continue annual mammogram, [name] encouraged her to do several exam. And she prefers to follow-up with [place] on a yearly basis.  -We discussed the clinical signs of cancer recurrence, and she knows what to watch. -[name] encouraged her to continue healthy diet and exercise regularly. She is very physically active.  -He is clinically doing very well, asymptomatic.  Labs reviewed and her, CBC and CMP normal, 11/2017 mammogram normal and breast exam normal, no evidence of recurrence.  -Continue breast cancer surveillance and annual visit.  2.   Osteopenia - her bone density got worse when she was on  Arimidex,  She was treated with Fosamax,  Which was held in February 2016 due to her some dental issue, she was on Fosamax for 1.5 years  - her repeat a bone density scan  In May 2016 showed  stable osteopenia, T score -1.2,  Similar to her bone density scan in 2014 -Repeated the bone density scan in May 2018 showed normal bone density, T score -0.9, improved  -She was on vitamin D before but stopped due to the extremely high vitamin D level after high dose supplement. [name] encouraged her to take calcium at least 1 g and vitamin D at least 1000 units daily,  And weight-bearing exercise -previous vitamin D was slightly low.  [name] will check her vitamin D level today, if it is very low, [name] will consider therapeutic dose 50,000 weekly for 8 weeks.    Plan:  -In 1 year with lab.  -Mammogram in 11/2018 -[name] will call to update regarding Vitamin D results   All questions were answered. The patient knows to call the clinic with any problems, questions or concerns. No barriers to learning was detected.  [name] spent 15 minutes counseling the patient face to face. The total time spent in the appointment was 20 minutes and more than 50% was on counseling and review of test results  [name], [name] am acting as scribe for Dr. Truitt Merle.  [name] have reviewed the above documentation for accuracy and completeness, and [name] agree with the above.    Truitt Merle, MD 02/23/2018

## 2018-02-23 ENCOUNTER — Encounter: Payer: Self-pay | Admitting: Hematology

## 2018-02-23 ENCOUNTER — Inpatient Hospital Stay: Payer: BLUE CROSS/BLUE SHIELD | Attending: Hematology

## 2018-02-23 ENCOUNTER — Telehealth: Payer: Self-pay | Admitting: Hematology

## 2018-02-23 ENCOUNTER — Inpatient Hospital Stay (HOSPITAL_BASED_OUTPATIENT_CLINIC_OR_DEPARTMENT_OTHER): Payer: BLUE CROSS/BLUE SHIELD | Admitting: Hematology

## 2018-02-23 VITALS — BP 133/75 | HR 65 | Temp 98.5°F | Resp 18 | Ht 62.0 in | Wt 148.6 lb

## 2018-02-23 DIAGNOSIS — Z780 Asymptomatic menopausal state: Secondary | ICD-10-CM | POA: Diagnosis not present

## 2018-02-23 DIAGNOSIS — Z853 Personal history of malignant neoplasm of breast: Secondary | ICD-10-CM

## 2018-02-23 DIAGNOSIS — M8580 Other specified disorders of bone density and structure, unspecified site: Secondary | ICD-10-CM

## 2018-02-23 DIAGNOSIS — E559 Vitamin D deficiency, unspecified: Secondary | ICD-10-CM | POA: Diagnosis not present

## 2018-02-23 DIAGNOSIS — C50412 Malignant neoplasm of upper-outer quadrant of left female breast: Secondary | ICD-10-CM

## 2018-02-23 LAB — CBC WITH DIFFERENTIAL/PLATELET
Basophils Absolute: 0 10*3/uL (ref 0.0–0.1)
Basophils Relative: 0 %
Eosinophils Absolute: 0.1 10*3/uL (ref 0.0–0.5)
Eosinophils Relative: 2 %
HEMATOCRIT: 38.8 % (ref 34.8–46.6)
HEMOGLOBIN: 12.9 g/dL (ref 11.6–15.9)
LYMPHS PCT: 29 %
Lymphs Abs: 1.8 10*3/uL (ref 0.9–3.3)
MCH: 28.9 pg (ref 25.1–34.0)
MCHC: 33.2 g/dL (ref 31.5–36.0)
MCV: 87 fL (ref 79.5–101.0)
MONOS PCT: 9 %
Monocytes Absolute: 0.6 10*3/uL (ref 0.1–0.9)
NEUTROS ABS: 3.7 10*3/uL (ref 1.5–6.5)
NEUTROS PCT: 60 %
Platelets: 260 10*3/uL (ref 145–400)
RBC: 4.46 MIL/uL (ref 3.70–5.45)
RDW: 13.5 % (ref 11.2–14.5)
WBC: 6.2 10*3/uL (ref 3.9–10.3)

## 2018-02-23 LAB — CMP (CANCER CENTER ONLY)
ALBUMIN: 3.7 g/dL (ref 3.5–5.0)
ALK PHOS: 81 U/L (ref 38–126)
ALT: 14 U/L (ref 0–44)
ANION GAP: 9 (ref 5–15)
AST: 20 U/L (ref 15–41)
BILIRUBIN TOTAL: 0.5 mg/dL (ref 0.3–1.2)
BUN: 13 mg/dL (ref 6–20)
CALCIUM: 9.1 mg/dL (ref 8.9–10.3)
CO2: 27 mmol/L (ref 22–32)
Chloride: 107 mmol/L (ref 98–111)
Creatinine: 0.78 mg/dL (ref 0.44–1.00)
GFR, Est AFR Am: >60 mL/min (ref >60)
GLUCOSE: 80 mg/dL (ref 70–99)
Potassium: 3.9 mmol/L (ref 3.5–5.1)
Sodium: 143 mmol/L (ref 135–145)
Total Protein: 7.1 g/dL (ref 6.5–8.1)

## 2018-02-23 NOTE — Telephone Encounter (Signed)
Scheduled appt per 8/8 los - gave patient AVS and calender per los.  

## 2018-02-24 LAB — VITAMIN D 25 HYDROXY (VIT D DEFICIENCY, FRACTURES): VIT D 25 HYDROXY: 44.9 ng/mL (ref 30.0–100.0)

## 2018-02-27 ENCOUNTER — Telehealth: Payer: Self-pay

## 2018-02-27 NOTE — Telephone Encounter (Signed)
-----   Message from Truitt Merle, MD sent at 02/25/2018 12:40 PM EDT ----- Please let pt know the vitD level is normal, I suggest her to take OTC VitD 1000u daily, no need higher prescription dose. Thanks   Truitt Merle  02/25/2018

## 2018-02-27 NOTE — Telephone Encounter (Signed)
Per Dr. Burr Medico spoke with patient, Vitamin D level is normal, she does suggest that she take OTC Vitamin D 1000u daily, patient verbalized an understanding.

## 2018-04-03 ENCOUNTER — Encounter: Payer: Self-pay | Admitting: Gastroenterology

## 2018-04-04 ENCOUNTER — Encounter: Payer: Self-pay | Admitting: Gastroenterology

## 2018-05-10 ENCOUNTER — Encounter: Payer: Self-pay | Admitting: Gastroenterology

## 2018-05-10 ENCOUNTER — Ambulatory Visit: Payer: BLUE CROSS/BLUE SHIELD | Admitting: Gastroenterology

## 2018-05-10 VITALS — BP 110/80 | HR 74 | Ht 63.0 in | Wt 149.1 lb

## 2018-05-10 DIAGNOSIS — K5900 Constipation, unspecified: Secondary | ICD-10-CM

## 2018-05-10 DIAGNOSIS — Z1212 Encounter for screening for malignant neoplasm of rectum: Secondary | ICD-10-CM

## 2018-05-10 DIAGNOSIS — Z1211 Encounter for screening for malignant neoplasm of colon: Secondary | ICD-10-CM

## 2018-05-10 NOTE — Progress Notes (Signed)
History of Present Illness: This is a 57 year old female self referred for the evaluation of colorectal cancer screening.  Colonoscopy was performed in May 2009 for left lower quadrant pain and maternal grandmother with colon cancer at age 51 and it was normal.  She relates occasional mild constipation but does not require laxative use.  This has not changed in several years.  She has no other gastrointestinal complaints. Denies weight loss, abdominal pain, diarrhea, change in stool caliber, melena, hematochezia, nausea, vomiting, dysphagia, reflux symptoms, chest pain.    No Known Allergies Outpatient Medications Prior to Visit  Medication Sig Dispense Refill  . Calcium-Phosphorus-Vitamin D (CALCIUM/D3 ADULT GUMMIES PO) Take 2-3 each by mouth daily. Vitafusion    . simvastatin (ZOCOR) 40 MG tablet TAKE 1 TABLET BY MOUTH EVERY OTHER NIGHT 45 tablet 2  . SOOLANTRA 1 % CREA      No facility-administered medications prior to visit.    Past Medical History:  Diagnosis Date  . Breast cancer ([place])   . Hyperlipidemia   . Osteopenia   . Personal history of radiation therapy 2008   Past Surgical History:  Procedure Laterality Date  . AUGMENTATION MAMMAPLASTY Bilateral 2001  . BREAST LUMPECTOMY Left 2008  . BREAST SURGERY  LEFT   LUMPECTOMY  . OOPHRECTOMY     BI LATERERAL FOR CANCER PREVENTON  . PILONIDAL CYST EXCISION     Social History   Socioeconomic History  . Marital status: Married    Spouse name: Not on file  . Number of children: 2  . Years of education: Not on file  . Highest education level: Not on file  Occupational History  . 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 Financial resource strain: Not on file  . Food insecurity:    Worry: Not on file    Inability: Not on file  . Transportation needs:    Medical: Not on file    Non-medical: Not on file  Tobacco Use  . Smoking status: Never Smoker  . Smokeless tobacco: Never Used  Substance  and Sexual Activity  . Alcohol use: No  . Drug use: No  . Sexual activity: Not on file  Lifestyle  . Physical activity:    Days per week: Not on file    Minutes per session: Not on file  . Stress: Not on file  Relationships  . Social connections:    Talks on phone: Not on file    Gets together: Not on file    Attends religious service: Not on file    Active member of club or organization: Not on file    Attends meetings of clubs or organizations: Not on file    Relationship status: Not on file  Other Topics Concern  . Not on file  Social History Narrative   REGULAR EXERCISE-- YES, WALKS DAILY      DIET: FRUITS AND VEGGIES,WATER   Family History  Problem Relation Age of Onset  . Coronary artery disease Father   . Heart attack Father   . Hypertension Father   . Hyperlipidemia Father   . Cancer Maternal Grandmother        COLON  . Breast cancer Neg Hx        Review of Systems: Pertinent positive and negative review of systems were noted in the above HPI section. All other review of systems were otherwise negative.    Physical Exam: General: Well developed, well nourished, no acute distress  Head: Normocephalic and atraumatic Eyes:  sclerae anicteric, EOMI Ears: Normal auditory acuity Mouth: No deformity or lesions Neck: Supple, no masses or thyromegaly Lungs: Clear throughout to auscultation Heart: Regular rate and rhythm; no murmurs, rubs or bruits Abdomen: Soft, non tender and non distended. No masses, hepatosplenomegaly or hernias noted. Normal Bowel sounds Rectal: Deferred to colonoscopy Musculoskeletal: Symmetrical with no gross deformities  Skin: No lesions on visible extremities Pulses:  Normal pulses noted Extremities: No clubbing, cyanosis, edema or deformities noted Neurological: Alert oriented x 4, grossly nonfocal Cervical Nodes:  No significant cervical adenopathy Inguinal Nodes: No significant inguinal adenopathy Psychological:  Alert and  cooperative. Normal mood and affect   Assessment and Recommendations:  1.  Colorectal cancer screening, average risk.  Stable mild constipation.  Increase dietary fiber and daily water intake.  Schedule colonoscopy. The risks (including bleeding, perforation, infection, missed lesions, medication reactions and possible hospitalization or surgery if complications occur), benefits, and alternatives to colonoscopy with possible biopsy and possible polypectomy were discussed with the patient and they consent to proceed. She states that she would like to have her colonoscopy in January and that she will call back to schedule.

## 2018-05-10 NOTE — Patient Instructions (Signed)
It has been recommended to you by your physician that you have a(n) Colonoscopy completed. Per your request, we did not schedule the procedure(s) today. Please contact our office at [phone] should you decide to have the procedure completed.  Thank you for choosing me and [facility].  Malcolm T. Stark, Jr., MD., FACG  

## 2018-07-27 DIAGNOSIS — D229 Melanocytic nevi, unspecified: Secondary | ICD-10-CM

## 2018-07-27 HISTORY — DX: Melanocytic nevi, unspecified: D22.9

## 2018-09-21 ENCOUNTER — Telehealth: Payer: Self-pay | Admitting: Family Medicine

## 2018-09-21 DIAGNOSIS — E7800 Pure hypercholesterolemia, unspecified: Secondary | ICD-10-CM

## 2018-09-21 NOTE — Telephone Encounter (Signed)
-----   Message from Ellamae Sia sent at 09/11/2018  3:28 PM EST ----- Regarding: Lab orders for Fridday, 3.6.20 Patient is scheduled for CPX labs, please order future labs, Thanks , Karna Christmas

## 2018-09-22 ENCOUNTER — Other Ambulatory Visit (INDEPENDENT_AMBULATORY_CARE_PROVIDER_SITE_OTHER): Payer: BLUE CROSS/BLUE SHIELD

## 2018-09-22 DIAGNOSIS — E7800 Pure hypercholesterolemia, unspecified: Secondary | ICD-10-CM

## 2018-09-22 LAB — COMPREHENSIVE METABOLIC PANEL
ALK PHOS: 67 U/L (ref 39–117)
ALT: 15 U/L (ref 0–35)
AST: 17 U/L (ref 0–37)
Albumin: 4.1 g/dL (ref 3.5–5.2)
BUN: 12 mg/dL (ref 6–23)
CHLORIDE: 106 meq/L (ref 96–112)
CO2: 28 meq/L (ref 19–32)
Calcium: 9 mg/dL (ref 8.4–10.5)
Creatinine, Ser: 0.75 mg/dL (ref 0.40–1.20)
GFR: 79.83 mL/min (ref >60.00)
Glucose, Bld: 85 mg/dL (ref 70–99)
POTASSIUM: 3.8 meq/L (ref 3.5–5.1)
SODIUM: 140 meq/L (ref 135–145)
Total Bilirubin: 0.6 mg/dL (ref 0.2–1.2)
Total Protein: 6.8 g/dL (ref 6.0–8.3)

## 2018-09-22 LAB — LIPID PANEL
Cholesterol: 192 mg/dL (ref 0–200)
HDL: 54.2 mg/dL (ref >39.00)
LDL CALC: 118 mg/dL — AB (ref 0–99)
NONHDL: 138.2
Total CHOL/HDL Ratio: 4
Triglycerides: 102 mg/dL (ref 0.0–149.0)
VLDL: 20.4 mg/dL (ref 0.0–40.0)

## 2018-09-29 ENCOUNTER — Ambulatory Visit (INDEPENDENT_AMBULATORY_CARE_PROVIDER_SITE_OTHER): Payer: BLUE CROSS/BLUE SHIELD | Admitting: Family Medicine

## 2018-09-29 ENCOUNTER — Encounter: Payer: Self-pay | Admitting: Family Medicine

## 2018-09-29 ENCOUNTER — Other Ambulatory Visit: Payer: Self-pay

## 2018-09-29 ENCOUNTER — Other Ambulatory Visit (HOSPITAL_COMMUNITY)
Admission: RE | Admit: 2018-09-29 | Discharge: 2018-09-29 | Disposition: A | Discharge status: Home / Self Care | Payer: BLUE CROSS/BLUE SHIELD | Source: Ambulatory Visit | Attending: Family Medicine | Admitting: Family Medicine

## 2018-09-29 VITALS — BP 110/64 | HR 71 | Resp 12 | Ht 63.0 in | Wt 151.5 lb

## 2018-09-29 DIAGNOSIS — E7800 Pure hypercholesterolemia, unspecified: Secondary | ICD-10-CM

## 2018-09-29 DIAGNOSIS — Z853 Personal history of malignant neoplasm of breast: Secondary | ICD-10-CM | POA: Diagnosis not present

## 2018-09-29 DIAGNOSIS — Z0000 Encounter for general adult medical examination without abnormal findings: Secondary | ICD-10-CM | POA: Insufficient documentation

## 2018-09-29 NOTE — Patient Instructions (Signed)
Keep working on healthy eating and regular exercise! 

## 2018-09-29 NOTE — Progress Notes (Signed)
Subjective:    Patient ID: [name], female    DOB: 14-Oct-1960, 58 y.o.   MRN: [id number]  HPI  The patient is here for annual wellness exam and preventative care.    Elevated Cholesterol:  LDL at good control on simvastatin 40 mg daily. Lab Results  Component Value Date   CHOL 192 09/22/2018   HDL 54.20 09/22/2018   LDLCALC 118 (H) 09/22/2018   LDLDIRECT 168.3 07/15/2008   TRIG 102.0 09/22/2018   CHOLHDL 4 09/22/2018  Using medications without problems:none Muscle aches: none Diet compliance: healthy diet. Exercise:treadmill 2-3 times a week, 1 hour at a time Other complaints:  Wt Readings from Last 3 Encounters:  09/29/18 151 lb 8 oz (68.7 kg)  05/10/18 149 lb 2 oz (67.6 kg)  02/23/18 148 lb 9.6 oz (67.4 kg)    Social History /Family History/Past Medical History reviewed in detail and updated in EMR if needed. Blood pressure 110/64, pulse 71, resp. rate 12, height 5\' 3"  (1.6 m), weight 151 lb 8 oz (68.7 kg), SpO2 98 %.  Review of Systems  Constitutional: Negative for fatigue and fever.  HENT: Negative for congestion.   Eyes: Negative for pain.  Respiratory: Negative for cough and shortness of breath.   Cardiovascular: Negative for chest pain, palpitations and leg swelling.  Gastrointestinal: Negative for abdominal pain.  Genitourinary: Negative for dysuria and vaginal bleeding.  Musculoskeletal: Negative for back pain.  Neurological: Negative for syncope, light-headedness and headaches.  Psychiatric/Behavioral: Negative for dysphoric mood.       Objective:   Physical Exam Constitutional:      General: She is not in acute distress.    Appearance: Normal appearance. She is well-developed. She is not ill-appearing or toxic-appearing.  HENT:     Head: Normocephalic.     Right Ear: Hearing, tympanic membrane, ear canal and external ear normal.     Left Ear: Hearing, tympanic membrane, ear canal and external ear normal.     Nose: Nose normal.  Eyes:   General: Lids are normal. Lids are everted, no foreign bodies appreciated.     Conjunctiva/sclera: Conjunctivae normal.     Pupils: Pupils are equal, round, and reactive to light.  Neck:     Musculoskeletal: Normal range of motion and neck supple.     Thyroid: No thyroid mass or thyromegaly.     Vascular: No carotid bruit.     Trachea: Trachea normal.  Cardiovascular:     Rate and Rhythm: Normal rate and regular rhythm.     Heart sounds: Normal heart sounds, S1 normal and S2 normal. No murmur. No gallop.   Pulmonary:     Effort: Pulmonary effort is normal. No respiratory distress.     Breath sounds: Normal breath sounds. No wheezing, rhonchi or rales.  Abdominal:     General: Bowel sounds are normal. There is no distension or abdominal bruit.     Palpations: Abdomen is soft. There is no fluid wave or mass.     Tenderness: There is no abdominal tenderness. There is no guarding or rebound.     Hernia: No hernia is present.  Genitourinary:    Exam position: Supine.     Labia:        Right: No rash, tenderness or lesion.        Left: No rash, tenderness or lesion.      Vagina: Normal.     Cervix: No cervical motion tenderness, discharge or friability.     Uterus: Not  enlarged and not tender.      Adnexa:        Right: No mass, tenderness or fullness.         Left: No mass, tenderness or fullness.    Lymphadenopathy:     Cervical: No cervical adenopathy.  Skin:    General: Skin is warm and dry.     Findings: No rash.  Neurological:     Mental Status: She is alert.     Cranial Nerves: No cranial nerve deficit.     Sensory: No sensory deficit.  Psychiatric:        Mood and Affect: Mood is not anxious or depressed.        Speech: Speech normal.        Behavior: Behavior normal. Behavior is cooperative.        Judgment: Judgment normal.           Assessment & Plan:  The patient's preventative maintenance and recommended screening tests for an annual wellness exam were  reviewed in full today. Brought up to date unless services declined.  Counselled on the importance of diet, exercise, and its role in overall health and mortality. The patient's FH and SH was reviewed, including their home life, tobacco status, and drug and alcohol status.   Continue DVE yearly given uterus still present. S/P BSO,  Ovary eval not indicated. Has cervix PAPs every5years, last nml 2015, neg HPV.Marland Kitchen due this year. Mammo: 2008 breast cancer, per ONC  nml 11/2017 DEXA: 10/2012: osteopenia (but was high risk since on Arimidex, now off arimidex for 3 years, shewas onfosamax ( was on for almost 2 years) to prevent deterioration, but has stopped now) Stable DEXA in   11/2014 osteopenia, and nml in 11/2016. Vaccines: uptodate with tetanus. Refused flu. Refused HIV screen. Hep C screen: done Colonoscopy 11/2007.. repeat due this year

## 2018-09-29 NOTE — Addendum Note (Signed)
Addended by: Drucilla Schmidt on: 09/29/2018 04:11 PM   Modules accepted: Orders

## 2018-10-03 LAB — CYTOLOGY - PAP
Diagnosis: NEGATIVE
HPV: NOT DETECTED

## 2018-11-30 ENCOUNTER — Other Ambulatory Visit: Payer: BLUE CROSS/BLUE SHIELD

## 2018-11-30 ENCOUNTER — Ambulatory Visit: Payer: BLUE CROSS/BLUE SHIELD

## 2019-01-02 ENCOUNTER — Ambulatory Visit
Admission: RE | Admit: 2019-01-02 | Discharge: 2019-01-02 | Disposition: A | Discharge status: Home / Self Care | Payer: BC Managed Care – PPO | Source: Ambulatory Visit | Attending: Hematology | Admitting: Hematology

## 2019-01-02 ENCOUNTER — Other Ambulatory Visit: Payer: Self-pay

## 2019-01-02 DIAGNOSIS — Z853 Personal history of malignant neoplasm of breast: Secondary | ICD-10-CM

## 2019-01-02 DIAGNOSIS — M8580 Other specified disorders of bone density and structure, unspecified site: Secondary | ICD-10-CM

## 2019-01-05 ENCOUNTER — Telehealth: Payer: Self-pay

## 2019-01-05 NOTE — Telephone Encounter (Signed)
Called pt to give DEXA results, normal per Dr. Burr Medico. No answer and unable to leave a voice mail message.

## 2019-01-05 NOTE — Telephone Encounter (Signed)
-----   Message from Truitt Merle, MD sent at 01/04/2019 11:02 PM EDT ----- Please let pt know her DEXA result, normal, thanks   Truitt Merle  01/04/2019

## 2019-01-10 ENCOUNTER — Other Ambulatory Visit: Payer: Self-pay | Admitting: Family Medicine

## 2019-02-23 ENCOUNTER — Other Ambulatory Visit: Payer: BLUE CROSS/BLUE SHIELD

## 2019-02-23 ENCOUNTER — Ambulatory Visit: Payer: BLUE CROSS/BLUE SHIELD | Admitting: Hematology

## 2019-03-15 NOTE — Progress Notes (Signed)
[place]   Telephone:(336) [phone] Fax:(336) [phone]   Clinic Follow up Note   Patient Care Team: Jinny Sanders, MD as PCP - General  Date of Service:  03/16/2019  CHIEF COMPLAINT: f/u left breast cancer   SUMMARY OF ONCOLOGIC HISTORY: Oncology History Overview Note  Breast cancer of upper-outer quadrant of left female breast   Staging form: Breast, AJCC 7th Edition     Pathologic: Stage IA (T1b, N0, cM0) - Unsigned      History of breast cancer, left  05/2006 Initial Diagnosis   Breast cancer of upper-outer quadrant of left female breast   05/2006 Initial Biopsy    left breast mass biopsy showed invasive ductal carcinoma, the biopsy was done in [facility]   05/2006 Receptors her2   ER positive, PR positive, HER-2 negative   05/2006 Imaging   Mammogram and ultrasound showed a 8 mm mass in the left breast 10:00 position   10/11/2006 Pathology Results    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 0 cm, 2 sentinel lymph nodes negative, (+)  DCIS   10/11/2006 Surgery   Left lumpectomy with negative margins.   11/22/2006 - 12/24/2006 Radiation Therapy    left breast irradiation, under Dr. [name]    02/2007 Surgery    bilateral salpingo-oophorectomy    03/2007 - 12/2012 Anti-estrogen oral therapy   adjuvant Arimidex 27m daily    11/28/2017 Mammogram   11/28/2017 Mammogram IMPRESSION: No mammographic evidence of malignancy. A result letter of this screening mammogram will be mailed directly to the patient.      CURRENT THERAPY:  Surveillance  INTERVAL HISTORY:  RYeny Schmollis here for a follow up of left breast cancer. She was last seen by me 1 year ago. She presents to the clinic alone. She notes left breast burning that has lasted for 2 months intermittently. She is not sure what triggered this. She denied feeling any mass but has scar tissue next to it from melanoma removed in 10/2018. This sensation is overall new to her.     REVIEW OF SYSTEMS:   Constitutional: Denies fevers, chills or abnormal weight loss Eyes: Denies blurriness of vision Ears, nose, mouth, throat, and face: Denies mucositis or sore throat Respiratory: Denies cough, dyspnea or wheezes Cardiovascular: Denies palpitation, chest discomfort or lower extremity swelling Gastrointestinal:  Denies nausea, heartburn or change in bowel habits Skin: Denies abnormal skin rashes Lymphatics: Denies new lymphadenopathy or easy bruising Neurological:Denies numbness, tingling or new weaknesses Behavioral/Psych: Mood is stable, no new changes  All other systems were reviewed with the patient and are negative.  MEDICAL HISTORY:  Past Medical History:  Diagnosis Date  . Breast cancer ([place]   . Hyperlipidemia   . Osteopenia   . Personal history of radiation therapy 2008    SURGICAL HISTORY: Past Surgical History:  Procedure Laterality Date  . AUGMENTATION MAMMAPLASTY Bilateral 2001  . BREAST LUMPECTOMY Left 2008  . BREAST SURGERY  LEFT   LUMPECTOMY  . OOPHRECTOMY     BI LATERERAL FOR CANCER PREVENTON  . PILONIDAL CYST EXCISION      I have reviewed the social history and family history with the patient and they are unchanged from previous note.  ALLERGIES:  has No Known Allergies.  MEDICATIONS:  Current Outpatient Medications  Medication Sig Dispense Refill  . Calcium-Phosphorus-Vitamin D (CALCIUM/D3 ADULT GUMMIES PO) Take 2-3 each by mouth daily. Vitafusion    . simvastatin (ZOCOR) 40 MG tablet TAKE 1 TABLET BY  MOUTH EVERY OTHER NIGHT 45 tablet 2   No current facility-administered medications for this visit.     PHYSICAL EXAMINATION: ECOG PERFORMANCE STATUS: 0 - Asymptomatic  Vitals:   03/16/19 1520  BP: 130/65  Pulse: 68  Resp: 18  Temp: 98.5 F (36.9 C)  SpO2: 100%   Filed Weights   03/16/19 1520  Weight: 148 lb 11.2 oz (67.4 kg)    GENERAL:alert, no distress and comfortable SKIN: skin color, texture, turgor are  normal, no rashes or significant lesions EYES: normal, Conjunctiva are pink and non-injected, sclera clear  NECK: supple, thyroid normal size, non-tender, without nodularity LYMPH:  no palpable lymphadenopathy in the cervical, axillary  LUNGS: clear to auscultation and percussion with normal breathing effort HEART: regular rate & rhythm and no murmurs and no lower extremity edema ABDOMEN:abdomen soft, non-tender and normal bowel sounds Musculoskeletal:no cyanosis of digits and no clubbing  NEURO: alert & oriented x 3 with fluent speech, no focal motor/sensory deficits BREAST: S/p left lumpectomy and b/l reconstruction with implants: Surgical incision healed well with mild scar tissue (+) Mild bump at incision line (+) Scar tissue from recent melanoma resection of right chest.   LABORATORY DATA:  I have reviewed the data as listed CBC Latest Ref Rng & Units 03/16/2019 02/23/2018 02/10/2017  WBC 4.0 - 10.5 K/uL 6.2 6.2 6.0  Hemoglobin 12.0 - 15.0 g/dL 13.2 12.9 14.1  Hematocrit 36.0 - 46.0 % 39.8 38.8 41.5  Platelets 150 - 400 K/uL 234 260 237     CMP Latest Ref Rng & Units 03/16/2019 09/22/2018 02/23/2018  Glucose 70 - 99 mg/dL 92 85 80  BUN 6 - 20 mg/dL '8 12 13  ' Creatinine 0.44 - 1.00 mg/dL 0.81 0.75 0.78  Sodium 135 - 145 mmol/L 143 140 143  Potassium 3.5 - 5.1 mmol/L 3.7 3.8 3.9  Chloride 98 - 111 mmol/L 107 106 107  CO2 22 - 32 mmol/L '27 28 27  ' Calcium 8.9 - 10.3 mg/dL 8.8(L) 9.0 9.1  Total Protein 6.5 - 8.1 g/dL 6.9 6.8 7.1  Total Bilirubin 0.3 - 1.2 mg/dL 0.6 0.6 0.5  Alkaline Phos 38 - 126 U/L 77 67 81  AST 15 - 41 U/L '23 17 20  ' ALT 0 - 44 U/L '17 15 14      ' RADIOGRAPHIC STUDIES: I have personally reviewed the radiological images as listed and agreed with the findings in the report. No results found.   ASSESSMENT & PLAN:  Latishia Suitt is a 58 y.o. female with   1. 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 She is s/p left lumpectomy and b/l reconstruction with implants, adjuvant radiation and 6 years of adjuvant Anastrozole.  -We discussed that she is more than 10 years out of her initial diagnosis, the risk of  Cancer recurrence is much smaller now, although we do sometimes see later recurrence in ER/PR positive breast cancer -She will continue annual mammogram, I encouraged her to do several exam. And she prefers to follow-up with [place] on a yearly basis.  -She is clinically stable. Labs reviewed, CBC and CMP WNL. Vitamin D still pending. Her physical exam showed a small lump below left breast incision. She also report intermittent pain at inner left breast. Her 12/2018 Mammogram was unremarkable. Will obtain a [place] of left breast to better evaluate. I have low suspicion this is related to her breast cancer.  -  Continue breast cancer surveillance and annual visit. Next mammogram in 12/2019  2.  Osteopenia -Her bone density got worse when she was on  Arimidex. She was treated with Fosamax which was held in February 2016 due to her some dental issue, she was on Fosamax for 1.5 years  -Her 11/2014 DEXA showed stable osteopenia, T score -1.2. Similar to her bone density scan in 2014.  -Her 11/2016 DEXA showed normal bone density, T score -0.9, improved.  -She is on calcium with Vitamin D. Will monitor her VitD level yearly  -12/2018 DEXA shows osteopenia with lowest T-score -1 at left femur neck. Mostly stable.    3. Skin cancer  -Per patient her dermatologist removed skin cancer from her left breast which was melanoma.  -She will continue to f/u with her Dermatologist.    Plan:  -[place] of left breast and axillar in 1-2 weeks  -Mammogram in 12/2019  -Lab and f/u in 1 year    No problem-specific Assessment & Plan notes found for this encounter.   Orders Placed This Encounter  Procedures  . US BREAST LTD UNI LEFT INC AXILLA    Standing Status:   Future     Standing Expiration Date:   05/15/2020    Order Specific Question:   Reason for Exam (SYMPTOM  OR DIAGNOSIS REQUIRED)    Answer:   left breast lump below lumpectomy incision, pt also noticed intermittent pain at inner left breast    Order Specific Question:   Preferred imaging location?    Answer:   [facility]    Order Specific Question:   Call Results- Best Contact Number?    Answer:   rule out cancer recurrnece   All questions were answered. The patient knows to call the clinic with any problems, questions or concerns. No barriers to learning was detected. I spent 15 minutes counseling the patient face to face. The total time spent in the appointment was 20 minutes and more than 50% was on counseling and review of test results     Truitt Merle, MD 03/16/2019   I, Joslyn Devon, am acting as scribe for Truitt Merle, MD.   I have reviewed the above documentation for accuracy and completeness, and I agree with the above.

## 2019-03-16 ENCOUNTER — Inpatient Hospital Stay: Payer: BC Managed Care – PPO | Attending: Hematology

## 2019-03-16 ENCOUNTER — Other Ambulatory Visit: Payer: Self-pay

## 2019-03-16 ENCOUNTER — Inpatient Hospital Stay (HOSPITAL_BASED_OUTPATIENT_CLINIC_OR_DEPARTMENT_OTHER): Payer: BC Managed Care – PPO | Admitting: Hematology

## 2019-03-16 VITALS — BP 130/65 | HR 68 | Temp 98.5°F | Resp 18 | Ht 63.0 in | Wt 148.7 lb

## 2019-03-16 DIAGNOSIS — E785 Hyperlipidemia, unspecified: Secondary | ICD-10-CM | POA: Insufficient documentation

## 2019-03-16 DIAGNOSIS — Z170 Estrogen receptor positive status [ER+]: Secondary | ICD-10-CM | POA: Diagnosis not present

## 2019-03-16 DIAGNOSIS — C50412 Malignant neoplasm of upper-outer quadrant of left female breast: Secondary | ICD-10-CM | POA: Diagnosis present

## 2019-03-16 DIAGNOSIS — Z79811 Long term (current) use of aromatase inhibitors: Secondary | ICD-10-CM | POA: Insufficient documentation

## 2019-03-16 DIAGNOSIS — M8580 Other specified disorders of bone density and structure, unspecified site: Secondary | ICD-10-CM | POA: Diagnosis not present

## 2019-03-16 DIAGNOSIS — Z853 Personal history of malignant neoplasm of breast: Secondary | ICD-10-CM

## 2019-03-16 DIAGNOSIS — Z79899 Other long term (current) drug therapy: Secondary | ICD-10-CM | POA: Insufficient documentation

## 2019-03-16 DIAGNOSIS — Z923 Personal history of irradiation: Secondary | ICD-10-CM | POA: Insufficient documentation

## 2019-03-16 LAB — CBC WITH DIFFERENTIAL/PLATELET
Abs Immature Granulocytes: 0.02 10*3/uL (ref 0.00–0.07)
Basophils Absolute: 0.1 10*3/uL (ref 0.0–0.1)
Basophils Relative: 1 %
Eosinophils Absolute: 0.1 10*3/uL (ref 0.0–0.5)
Eosinophils Relative: 1 %
HCT: 39.8 % (ref 36.0–46.0)
Hemoglobin: 13.2 g/dL (ref 12.0–15.0)
Immature Granulocytes: 0 %
Lymphocytes Relative: 34 %
Lymphs Abs: 2.1 10*3/uL (ref 0.7–4.0)
MCH: 28.9 pg (ref 26.0–34.0)
MCHC: 33.2 g/dL (ref 30.0–36.0)
MCV: 87.1 fL (ref 80.0–100.0)
Monocytes Absolute: 0.5 10*3/uL (ref 0.1–1.0)
Monocytes Relative: 8 %
Neutro Abs: 3.5 10*3/uL (ref 1.7–7.7)
Neutrophils Relative %: 56 %
Platelets: 234 10*3/uL (ref 150–400)
RBC: 4.57 MIL/uL (ref 3.87–5.11)
RDW: 13.5 % (ref 11.5–15.5)
WBC: 6.2 10*3/uL (ref 4.0–10.5)
nRBC: 0 % (ref 0.0–0.2)

## 2019-03-16 LAB — CMP (CANCER CENTER ONLY)
ALT: 17 U/L (ref 0–44)
AST: 23 U/L (ref 15–41)
Albumin: 3.7 g/dL (ref 3.5–5.0)
Alkaline Phosphatase: 77 U/L (ref 38–126)
Anion gap: 9 (ref 5–15)
BUN: 8 mg/dL (ref 6–20)
CO2: 27 mmol/L (ref 22–32)
Calcium: 8.8 mg/dL — ABNORMAL LOW (ref 8.9–10.3)
Chloride: 107 mmol/L (ref 98–111)
Creatinine: 0.81 mg/dL (ref 0.44–1.00)
GFR, Est AFR Am: >60 mL/min (ref >60)
GFR, Estimated: >60 mL/min (ref >60)
Glucose, Bld: 92 mg/dL (ref 70–99)
Potassium: 3.7 mmol/L (ref 3.5–5.1)
Sodium: 143 mmol/L (ref 135–145)
Total Bilirubin: 0.6 mg/dL (ref 0.3–1.2)
Total Protein: 6.9 g/dL (ref 6.5–8.1)

## 2019-03-17 ENCOUNTER — Encounter: Payer: Self-pay | Admitting: Hematology

## 2019-03-17 LAB — VITAMIN D 25 HYDROXY (VIT D DEFICIENCY, FRACTURES): Vit D, 25-Hydroxy: 40.2 ng/mL (ref 30.0–100.0)

## 2019-03-19 ENCOUNTER — Telehealth: Payer: Self-pay | Admitting: Hematology

## 2019-03-19 NOTE — Telephone Encounter (Signed)
Scheduled appt per 8/28 lol.  A calendar will be mailed out.

## 2019-03-29 ENCOUNTER — Other Ambulatory Visit: Payer: Self-pay

## 2019-03-29 ENCOUNTER — Other Ambulatory Visit: Payer: Self-pay | Admitting: Hematology

## 2019-03-29 ENCOUNTER — Ambulatory Visit
Admission: RE | Admit: 2019-03-29 | Discharge: 2019-03-29 | Disposition: A | Discharge status: Home / Self Care | Payer: BC Managed Care – PPO | Source: Ambulatory Visit | Attending: Hematology | Admitting: Hematology

## 2019-03-29 DIAGNOSIS — Z853 Personal history of malignant neoplasm of breast: Secondary | ICD-10-CM

## 2019-03-29 DIAGNOSIS — N6320 Unspecified lump in the left breast, unspecified quadrant: Secondary | ICD-10-CM

## 2019-04-03 ENCOUNTER — Other Ambulatory Visit: Payer: Self-pay | Admitting: Diagnostic Radiology

## 2019-04-03 ENCOUNTER — Other Ambulatory Visit: Payer: Self-pay

## 2019-04-03 ENCOUNTER — Ambulatory Visit
Admission: RE | Admit: 2019-04-03 | Discharge: 2019-04-03 | Disposition: A | Discharge status: Home / Self Care | Payer: BC Managed Care – PPO | Source: Ambulatory Visit | Attending: Hematology | Admitting: Hematology

## 2019-04-03 DIAGNOSIS — N6320 Unspecified lump in the left breast, unspecified quadrant: Secondary | ICD-10-CM

## 2019-04-03 DIAGNOSIS — Z853 Personal history of malignant neoplasm of breast: Secondary | ICD-10-CM

## 2019-04-19 ENCOUNTER — Other Ambulatory Visit (HOSPITAL_COMMUNITY): Payer: Self-pay | Admitting: Surgery

## 2019-04-19 DIAGNOSIS — N6320 Unspecified lump in the left breast, unspecified quadrant: Secondary | ICD-10-CM

## 2019-05-02 ENCOUNTER — Other Ambulatory Visit (HOSPITAL_COMMUNITY): Payer: Self-pay | Admitting: Surgery

## 2019-05-02 DIAGNOSIS — N6320 Unspecified lump in the left breast, unspecified quadrant: Secondary | ICD-10-CM

## 2019-05-31 ENCOUNTER — Other Ambulatory Visit: Payer: Self-pay

## 2019-05-31 ENCOUNTER — Encounter (HOSPITAL_BASED_OUTPATIENT_CLINIC_OR_DEPARTMENT_OTHER): Payer: Self-pay | Admitting: *Deleted

## 2019-06-04 ENCOUNTER — Other Ambulatory Visit (HOSPITAL_COMMUNITY): Payer: BC Managed Care – PPO

## 2019-06-04 ENCOUNTER — Other Ambulatory Visit (HOSPITAL_COMMUNITY)
Admission: RE | Admit: 2019-06-04 | Discharge: 2019-06-04 | Disposition: A | Discharge status: Home / Self Care | Payer: BC Managed Care – PPO | Source: Ambulatory Visit | Attending: Surgery | Admitting: Surgery

## 2019-06-04 DIAGNOSIS — Z20828 Contact with and (suspected) exposure to other viral communicable diseases: Secondary | ICD-10-CM | POA: Insufficient documentation

## 2019-06-04 DIAGNOSIS — Z01812 Encounter for preprocedural laboratory examination: Secondary | ICD-10-CM | POA: Diagnosis present

## 2019-06-04 LAB — SARS CORONAVIRUS 2 (TAT 6-24 HRS): SARS Coronavirus 2: NEGATIVE

## 2019-06-06 ENCOUNTER — Other Ambulatory Visit: Payer: Self-pay

## 2019-06-06 ENCOUNTER — Ambulatory Visit
Admission: RE | Admit: 2019-06-06 | Discharge: 2019-06-06 | Disposition: A | Discharge status: Home / Self Care | Payer: BC Managed Care – PPO | Source: Ambulatory Visit | Attending: Surgery | Admitting: Surgery

## 2019-06-06 DIAGNOSIS — N6320 Unspecified lump in the left breast, unspecified quadrant: Secondary | ICD-10-CM

## 2019-06-06 NOTE — H&P (Signed)
[name]  Location: [facility] Patient #: [id number] DOB: [birth year] Married / Language: English / Race: White Female  History of Present Illness   The patient is a 58 year old female who presents with a complaint of left bresat mass.  She goes by "[name]"  The PCP is Dr. Eliezer Lofts  The patient was referred by Dr. Lovey Newcomer She comes by herself.  [The Covid-19 virus has disrupted normal medical care in [place] and across the nation. We have sometimes had to alter normal surgical/medical care to limit this epidemic and we have explained these changes to the patient.]  She felt a nodule in the scar of her prior left breast lumpectomy scar. She had a mammogram on 03/29/2019 which showed a 10 x 6 mm indistinct mass at 11 o'clock in the left breast. She underwent a left breast biopsy ([id number]) on 04/03/2019 at 11 o'clock which showed benign fibroadipose tissue. This was felt to be discordant. She had a left breast cancer in 2008 treated with lumpectomy, left axillary sentinel lymph node, radiation therapy, and antihormone therapy. She has done well since that time. The original cancer she said she actually palpated. And this new area in her left breast/scar she fell a nodule.  I went over with her a seed localization lumpectomy. I discussed the risk including, bleeding, nerve injury, injury to implant, and the possibility of needing more surgery.  Plan: 1. Left breast lumpectomy (seed localization)  Review of Systems as stated in this history (HPI) or in the review of systems. Otherwise all other 12 point ROS are negative  Past Medical History: 1. Left breast cancer - 2008 Lumpectomy - 2008 [name], Rad tx [name] On arimidex - followed by Truddie Coco Now followed by Dr. Burr Medico 2. Bilateral salpingo-oophorectomy - 02/2007 3. Breast augmentation - 2000 - [place] 4. Last colonoscopy - 2009 -  [place] Plan she is looking at getting another one 5. Melanoma over sternum - there is no pathology in Epic Excised in [place] History: Married. Has 2 sons She helps with the lawn care business   Past Surgical History (Tanisha A. Owens Shark, [place]; 04/19/2019 8:42 AM) Oral Surgery   Diagnostic Studies History (Tanisha A. Owens Shark, [place]; 04/19/2019 8:42 AM) Mammogram  within last year Pap Smear  1-5 years ago  Allergies (Tanisha A. Owens Shark, [place]; 04/19/2019 8:43 AM) No Known Drug Allergies [04/19/2019]: Allergies Reconciled   Medication History (Tanisha A. Owens Shark, [place]; 04/19/2019 8:43 AM) Simvastatin (40MG Tablet, Oral) Active. Medications Reconciled  Social History (Tanisha A. Owens Shark, [place]; 04/19/2019 8:42 AM) Alcohol use  Occasional alcohol use. Caffeine use  Carbonated beverages, Tea. No drug use  Tobacco use  Never smoker.  Family History (Tanisha A. Owens Shark, [place]; 04/19/2019 8:42 AM) Arthritis  Mother. Bleeding disorder  Father. Diabetes Mellitus  Father. Heart Disease  Father. Heart disease in female family member before age 20  Hypertension  Father, Mother.  Pregnancy / Birth History (Tanisha A. Owens Shark, [place]; 04/19/2019 8:42 AM) Age at menarche  79 years. Age of menopause  <46 Gravida  2 Irregular periods  Length (months) of breastfeeding  7-12 Maternal age  89-30 Para  2  Other Problems (Tanisha A. Owens Shark, [place]; 04/19/2019 8:42 AM) Arthritis  Breast Cancer  Hypercholesterolemia  Melanoma  Oophorectomy     Review of Systems (Tanisha A. Brown RMA; 04/19/2019 8:43 AM) General Not Present- Appetite Loss, Chills, Fatigue, Fever, Night Sweats, Weight Gain and Weight Loss. Skin Not Present- Change in Wart/Mole, Dryness,  Hives, Jaundice, New Lesions, Non-Healing Wounds, Rash and Ulcer. HEENT Not Present- Earache, Hearing Loss, Hoarseness, Nose Bleed, Oral Ulcers, Ringing in the Ears, Seasonal Allergies, Sinus Pain, Sore Throat, Visual  Disturbances, Wears glasses/contact lenses and Yellow Eyes. Respiratory Not Present- Bloody sputum, Chronic Cough, Difficulty Breathing, Snoring and Wheezing. Breast Present- Breast Mass and Breast Pain. Not Present- Nipple Discharge and Skin Changes. Cardiovascular Not Present- Chest Pain, Difficulty Breathing Lying Down, Leg Cramps, Palpitations, Rapid Heart Rate, Shortness of Breath and Swelling of Extremities. Gastrointestinal Present- Constipation. Not Present- Abdominal Pain, Bloating, Bloody Stool, Change in Bowel Habits, Chronic diarrhea, Difficulty Swallowing, Excessive gas, Gets full quickly at meals, Hemorrhoids, Indigestion, Nausea, Rectal Pain and Vomiting. Female Genitourinary Not Present- Frequency, Nocturia, Painful Urination, Pelvic Pain and Urgency. Musculoskeletal Present- Joint Pain. Not Present- Back Pain, Joint Stiffness, Muscle Pain, Muscle Weakness and Swelling of Extremities. Neurological Not Present- Decreased Memory, Fainting, Headaches, Numbness, Seizures, Tingling, Tremor, Trouble walking and Weakness. Psychiatric Not Present- Anxiety, Bipolar, Change in Sleep Pattern, Depression, Fearful and Frequent crying. Endocrine Not Present- Cold Intolerance, Excessive Hunger, Hair Changes, Heat Intolerance, Hot flashes and New Diabetes. Hematology Not Present- Blood Thinners, Easy Bruising, Excessive bleeding, Gland problems, HIV and Persistent Infections.  Vitals (Tanisha A. Brown RMA; 04/19/2019 8:43 AM) 04/19/2019 8:43 AM Weight: 146.4 lb Height: 63in Body Surface Area: 1.69 m Body Mass Index: 25.93 kg/m  Temp.: 97.35F  Pulse: 96 (Regular)  BP: 128/72 (Sitting, Left Arm, Standard)   Physical Exam  General: WN WF who is alert and generally healthy appearing. She is wearing a mask. HEENT: Normal. Pupils equal.  Neck: Supple. No mass. No thyroid mass. Lymph Nodes: No supraclavicular or cervical nodes.  Lungs: Clear to auscultation and symmetric breath  sounds. Heart: RRR. No murmur or rub.  Breasts: Right - with implant  Left - scar in the upper breast - with mild nodularity in the mid scar. The breast is smaller and somewhat contracted. She also complains of some discomfort in the medial aspect of the left breast - almost over the sternum. She has a scar from a melanoma excision across her sternum.  Abdomen: Soft. No mass. No tenderness. No hernia. Normal bowel sounds. Rectal: Not done.  Extremities: Good strength and ROM in upper and lower extremities.  Neurologic: Grossly intact to motor and sensory function. Psychiatric: Has normal mood and affect. Behavior is normal.   Assessment & Plan  1.  LEFT BREAST MASS (N63.20)  Discordant biopsy - 03/29/2019  Plan:  1. Excise the mass area of the breast - seed localization  2.  HISTORY OF LEFT BREAST CANCER (Z85.3)  Story: Left breast lumpectomy - 10/11/2006 - [place]  1.0 cm IDC, ER - pos, PR - pos, Her2 - neg, 0/2 nodes   Rad tx - [place]  On arimidex - followed by Truddie Coco  Now followed by Dr. Burr Medico 3. Breast augmentation - 2000 - [place] 4. Melanoma over sternum - there is no pathology in Epic Excised in [place], MD, [facility] Office phone:  [phone]

## 2019-06-06 NOTE — Anesthesia Preprocedure Evaluation (Addendum)
Anesthesia Evaluation  Patient identified by MRN, date of birth, ID band Patient awake    Reviewed: Allergy & Precautions, NPO status , Patient's Chart, lab work & pertinent test results  Airway Mallampati: III  TM Distance: >3 FB Neck ROM: Full  Mouth opening: Limited Mouth Opening  Dental no notable dental hx. (+) Chipped, Dental Advisory Given,    Pulmonary neg pulmonary ROS,    Pulmonary exam normal breath sounds clear to auscultation       Cardiovascular Normal cardiovascular exam Rhythm:Regular Rate:Normal  HLD   Neuro/Psych  Headaches, negative psych ROS   GI/Hepatic negative GI ROS, Neg liver ROS,   Endo/Other  negative endocrine ROS  Renal/GU negative Renal ROS  negative genitourinary   Musculoskeletal  (+) Arthritis ,   Abdominal   Peds  Hematology negative hematology ROS (+)   Anesthesia Other Findings   Reproductive/Obstetrics                            Anesthesia Physical Anesthesia Plan  ASA: II  Anesthesia Plan: General   Post-op Pain Management:    Induction: Intravenous  PONV Risk Score and Plan: Ondansetron, Dexamethasone and Midazolam  Airway Management Planned: LMA  Additional Equipment:   Intra-op Plan:   Post-operative Plan: Extubation in OR  Informed Consent: I have reviewed the patients History and Physical, chart, labs and discussed the procedure including the risks, benefits and alternatives for the proposed anesthesia with the patient or authorized representative who has indicated his/her understanding and acceptance.     Dental advisory given  Plan Discussed with: CRNA  Anesthesia Plan Comments:         Anesthesia Quick Evaluation

## 2019-06-06 NOTE — Progress Notes (Signed)
      Enhanced Recovery after Surgery  Enhanced Recovery after Surgery is a protocol used to improve the stress on your body and your recovery after surgery.  Patient Instructions  . The night before surgery:  o No food after midnight. ONLY clear liquids after midnight  . The day of surgery (if you do NOT have diabetes):  o Drink ONE (1) Pre-Surgery Clear Ensure as directed.   o This drink was given to you during your hospital  pre-op appointment visit. o The pre-op nurse will instruct you on the time to drink the  Pre-Surgery Ensure depending on your surgery time. o Finish the drink at the designated time by the pre-op nurse.  o Nothing else to drink after completing the  Pre-Surgery Clear Ensure.  . The day of surgery (if you have diabetes): o Drink ONE (1) Gatorade 2 (G2) as directed. o This drink was given to you during your hospital  pre-op appointment visit.  o The pre-op nurse will instruct you on the time to drink the   Gatorade 2 (G2) depending on your surgery time. o Color of the Gatorade may vary. Red is not allowed. o Nothing else to drink after completing the  Gatorade 2 (G2).         If you have questions, please contact your surgeon's office. 

## 2019-06-07 ENCOUNTER — Ambulatory Visit (HOSPITAL_BASED_OUTPATIENT_CLINIC_OR_DEPARTMENT_OTHER): Payer: BC Managed Care – PPO | Admitting: Anesthesiology

## 2019-06-07 ENCOUNTER — Encounter (HOSPITAL_BASED_OUTPATIENT_CLINIC_OR_DEPARTMENT_OTHER): Payer: Self-pay

## 2019-06-07 ENCOUNTER — Ambulatory Visit
Admission: RE | Admit: 2019-06-07 | Discharge: 2019-06-07 | Disposition: A | Discharge status: Home / Self Care | Payer: BC Managed Care – PPO | Source: Ambulatory Visit | Attending: Surgery | Admitting: Surgery

## 2019-06-07 ENCOUNTER — Ambulatory Visit (HOSPITAL_BASED_OUTPATIENT_CLINIC_OR_DEPARTMENT_OTHER)
Admission: RE | Admit: 2019-06-07 | Discharge: 2019-06-07 | Disposition: A | Discharge status: Home / Self Care | Payer: BC Managed Care – PPO | Attending: Surgery | Admitting: Surgery

## 2019-06-07 ENCOUNTER — Other Ambulatory Visit: Payer: Self-pay

## 2019-06-07 ENCOUNTER — Encounter (HOSPITAL_BASED_OUTPATIENT_CLINIC_OR_DEPARTMENT_OTHER)
Admission: RE | Disposition: A | Discharge status: Home / Self Care | Payer: Self-pay | Source: Home / Self Care | Attending: Surgery

## 2019-06-07 DIAGNOSIS — Z853 Personal history of malignant neoplasm of breast: Secondary | ICD-10-CM | POA: Diagnosis not present

## 2019-06-07 DIAGNOSIS — N6032 Fibrosclerosis of left breast: Secondary | ICD-10-CM | POA: Insufficient documentation

## 2019-06-07 DIAGNOSIS — N6320 Unspecified lump in the left breast, unspecified quadrant: Secondary | ICD-10-CM

## 2019-06-07 DIAGNOSIS — Z85820 Personal history of malignant melanoma of skin: Secondary | ICD-10-CM | POA: Diagnosis not present

## 2019-06-07 DIAGNOSIS — Z923 Personal history of irradiation: Secondary | ICD-10-CM | POA: Diagnosis not present

## 2019-06-07 HISTORY — PX: BREAST EXCISIONAL BIOPSY: SUR124

## 2019-06-07 HISTORY — PX: BREAST LUMPECTOMY WITH RADIOACTIVE SEED LOCALIZATION: SHX6424

## 2019-06-07 SURGERY — BREAST LUMPECTOMY WITH RADIOACTIVE SEED LOCALIZATION
Anesthesia: General | Site: Breast | Laterality: Left

## 2019-06-07 MED ORDER — KETOROLAC TROMETHAMINE 30 MG/ML IJ SOLN
INTRAMUSCULAR | Status: AC
  Filled 2019-06-07: qty 1

## 2019-06-07 MED ORDER — PROPOFOL 10 MG/ML IV BOLUS
INTRAVENOUS | Status: AC
  Filled 2019-06-07: qty 20

## 2019-06-07 MED ORDER — FENTANYL CITRATE (PF) 100 MCG/2ML IJ SOLN
INTRAMUSCULAR | Status: DC | PRN
  Administered 2019-06-07 (×2): 50 ug via INTRAVENOUS

## 2019-06-07 MED ORDER — ACETAMINOPHEN 500 MG PO TABS
1000.0000 mg | ORAL_TABLET | ORAL | Status: AC
  Administered 2019-06-07: 1000 mg via ORAL

## 2019-06-07 MED ORDER — ONDANSETRON HCL 4 MG/2ML IJ SOLN
INTRAMUSCULAR | Status: AC
  Filled 2019-06-07: qty 4

## 2019-06-07 MED ORDER — BUPIVACAINE HCL (PF) 0.25 % IJ SOLN
INTRAMUSCULAR | Status: AC
  Filled 2019-06-07: qty 210

## 2019-06-07 MED ORDER — FENTANYL CITRATE (PF) 100 MCG/2ML IJ SOLN
25.0000 ug | INTRAMUSCULAR | Status: DC | PRN
  Administered 2019-06-07: 09:00:00 25 ug via INTRAVENOUS

## 2019-06-07 MED ORDER — MIDAZOLAM HCL 5 MG/5ML IJ SOLN
INTRAMUSCULAR | Status: DC | PRN
  Administered 2019-06-07: 2 mg via INTRAVENOUS

## 2019-06-07 MED ORDER — BUPIVACAINE HCL (PF) 0.5 % IJ SOLN
INTRAMUSCULAR | Status: AC
  Filled 2019-06-07: qty 60

## 2019-06-07 MED ORDER — CHLORHEXIDINE GLUCONATE CLOTH 2 % EX PADS: 6.0000 | MEDICATED_PAD | Freq: Once | CUTANEOUS | Status: DC

## 2019-06-07 MED ORDER — MIDAZOLAM HCL 2 MG/2ML IJ SOLN
INTRAMUSCULAR | Status: AC
  Filled 2019-06-07: qty 2

## 2019-06-07 MED ORDER — SODIUM CHLORIDE (PF) 0.9 % IJ SOLN
INTRAMUSCULAR | Status: AC
  Filled 2019-06-07: qty 20

## 2019-06-07 MED ORDER — ONDANSETRON HCL 4 MG/2ML IJ SOLN
INTRAMUSCULAR | Status: DC | PRN
  Administered 2019-06-07: 4 mg via INTRAVENOUS

## 2019-06-07 MED ORDER — KETOROLAC TROMETHAMINE 30 MG/ML IJ SOLN
INTRAMUSCULAR | Status: DC | PRN
  Administered 2019-06-07: 30 mg via INTRAVENOUS

## 2019-06-07 MED ORDER — TRAMADOL HCL 50 MG PO TABS: 50.0000 mg | ORAL_TABLET | Freq: Four times a day (QID) | ORAL | 0 refills | Status: DC | PRN

## 2019-06-07 MED ORDER — MIDAZOLAM HCL 2 MG/2ML IJ SOLN: 1.0000 mg | INTRAMUSCULAR | Status: DC | PRN

## 2019-06-07 MED ORDER — LACTATED RINGERS IV SOLN
INTRAVENOUS | Status: DC
  Administered 2019-06-07: 07:00:00 via INTRAVENOUS

## 2019-06-07 MED ORDER — ACETAMINOPHEN 500 MG PO TABS
ORAL_TABLET | ORAL | Status: AC
  Filled 2019-06-07: qty 2

## 2019-06-07 MED ORDER — CEFAZOLIN SODIUM-DEXTROSE 2-4 GM/100ML-% IV SOLN
2.0000 g | INTRAVENOUS | Status: AC
  Administered 2019-06-07: 2 g via INTRAVENOUS

## 2019-06-07 MED ORDER — FENTANYL CITRATE (PF) 100 MCG/2ML IJ SOLN
INTRAMUSCULAR | Status: AC
  Filled 2019-06-07: qty 2

## 2019-06-07 MED ORDER — METHYLENE BLUE 0.5 % INJ SOLN
INTRAVENOUS | Status: AC
  Filled 2019-06-07: qty 10

## 2019-06-07 MED ORDER — DEXAMETHASONE SODIUM PHOSPHATE 10 MG/ML IJ SOLN
INTRAMUSCULAR | Status: AC
  Filled 2019-06-07: qty 1

## 2019-06-07 MED ORDER — GABAPENTIN 300 MG PO CAPS
300.0000 mg | ORAL_CAPSULE | ORAL | Status: AC
  Administered 2019-06-07: 300 mg via ORAL

## 2019-06-07 MED ORDER — FENTANYL CITRATE (PF) 100 MCG/2ML IJ SOLN: 50.0000 ug | INTRAMUSCULAR | Status: DC | PRN

## 2019-06-07 MED ORDER — PROPOFOL 10 MG/ML IV BOLUS
INTRAVENOUS | Status: DC | PRN
  Administered 2019-06-07: 150 mg via INTRAVENOUS

## 2019-06-07 MED ORDER — BUPIVACAINE HCL (PF) 0.25 % IJ SOLN
INTRAMUSCULAR | Status: DC | PRN
  Administered 2019-06-07: 20 mL

## 2019-06-07 MED ORDER — DEXAMETHASONE SODIUM PHOSPHATE 10 MG/ML IJ SOLN
INTRAMUSCULAR | Status: DC | PRN
  Administered 2019-06-07: 10 mg via INTRAVENOUS

## 2019-06-07 MED ORDER — LIDOCAINE 2% (20 MG/ML) 5 ML SYRINGE
INTRAMUSCULAR | Status: DC | PRN
  Administered 2019-06-07: 60 mg via INTRAVENOUS

## 2019-06-07 MED ORDER — GABAPENTIN 300 MG PO CAPS
ORAL_CAPSULE | ORAL | Status: AC
  Filled 2019-06-07: qty 1

## 2019-06-07 MED ORDER — LIDOCAINE 2% (20 MG/ML) 5 ML SYRINGE
INTRAMUSCULAR | Status: AC
  Filled 2019-06-07: qty 5

## 2019-06-07 MED ORDER — CEFAZOLIN SODIUM-DEXTROSE 2-4 GM/100ML-% IV SOLN
INTRAVENOUS | Status: AC
  Filled 2019-06-07: qty 100

## 2019-06-07 SURGICAL SUPPLY — 45 items
ADH SKN CLS APL DERMABOND .7 (GAUZE/BANDAGES/DRESSINGS) ×1
APL PRP STRL LF DISP 70% ISPRP (MISCELLANEOUS) ×1
BINDER BREAST LRG (GAUZE/BANDAGES/DRESSINGS) ×2 IMPLANT
BLADE SURG 15 STRL LF DISP TIS (BLADE) ×1 IMPLANT
BLADE SURG 15 STRL SS (BLADE) ×3
CANISTER SUC SOCK COL 7IN (MISCELLANEOUS) IMPLANT
CANISTER SUCT 1200ML W/VALVE (MISCELLANEOUS) ×3 IMPLANT
CHLORAPREP W/TINT 26 (MISCELLANEOUS) ×3 IMPLANT
COVER BACK TABLE REUSABLE LG (DRAPES) ×3 IMPLANT
COVER MAYO STAND REUSABLE (DRAPES) ×3 IMPLANT
COVER PROBE W GEL 5X96 (DRAPES) ×3 IMPLANT
DECANTER SPIKE VIAL GLASS SM (MISCELLANEOUS) IMPLANT
DERMABOND ADVANCED (GAUZE/BANDAGES/DRESSINGS) ×2
DERMABOND ADVANCED .7 DNX12 (GAUZE/BANDAGES/DRESSINGS) ×1 IMPLANT
DRAPE LAPAROSCOPIC ABDOMINAL (DRAPES) ×3 IMPLANT
DRAPE UTILITY XL STRL (DRAPES) ×3 IMPLANT
DRSG PAD ABDOMINAL 8X10 ST (GAUZE/BANDAGES/DRESSINGS) ×2 IMPLANT
ELECT COATED BLADE 2.86 ST (ELECTRODE) ×3 IMPLANT
ELECT REM PT RETURN 9FT ADLT (ELECTROSURGICAL) ×3
ELECTRODE REM PT RTRN 9FT ADLT (ELECTROSURGICAL) ×1 IMPLANT
GAUZE SPONGE 4X4 12PLY STRL (GAUZE/BANDAGES/DRESSINGS) ×3 IMPLANT
GAUZE SPONGE 4X4 12PLY STRL LF (GAUZE/BANDAGES/DRESSINGS) ×2 IMPLANT
GLOVE SURG SYN 7.5  E (GLOVE) ×2
GLOVE SURG SYN 7.5 E (GLOVE) ×1 IMPLANT
GLOVE SURG SYN 7.5 PF PI (GLOVE) ×1 IMPLANT
GOWN STRL REUS W/ TWL LRG LVL3 (GOWN DISPOSABLE) ×1 IMPLANT
GOWN STRL REUS W/ TWL XL LVL3 (GOWN DISPOSABLE) ×1 IMPLANT
GOWN STRL REUS W/TWL LRG LVL3 (GOWN DISPOSABLE) ×3
GOWN STRL REUS W/TWL XL LVL3 (GOWN DISPOSABLE) ×3
KIT MARKER MARGIN INK (KITS) ×3 IMPLANT
NDL HYPO 25X1 1.5 SAFETY (NEEDLE) ×1 IMPLANT
NEEDLE HYPO 25X1 1.5 SAFETY (NEEDLE) ×3 IMPLANT
NS IRRIG 1000ML POUR BTL (IV SOLUTION) ×2 IMPLANT
PACK BASIN DAY SURGERY FS (CUSTOM PROCEDURE TRAY) ×3 IMPLANT
PENCIL SMOKE EVACUATOR (MISCELLANEOUS) ×3 IMPLANT
SLEEVE SCD COMPRESS KNEE MED (MISCELLANEOUS) ×3 IMPLANT
SPONGE LAP 18X18 RF (DISPOSABLE) ×3 IMPLANT
SUT MNCRL AB 4-0 PS2 18 (SUTURE) ×3 IMPLANT
SUT VICRYL 3-0 CR8 SH (SUTURE) ×3 IMPLANT
SYR CONTROL 10ML LL (SYRINGE) ×3 IMPLANT
TOWEL GREEN STERILE FF (TOWEL DISPOSABLE) ×3 IMPLANT
TRAY FAXITRON CT DISP (TRAY / TRAY PROCEDURE) ×3 IMPLANT
TUBE CONNECTING 20'X1/4 (TUBING) ×1
TUBE CONNECTING 20X1/4 (TUBING) ×2 IMPLANT
YANKAUER SUCT BULB TIP NO VENT (SUCTIONS) ×3 IMPLANT

## 2019-06-07 NOTE — Discharge Instructions (Signed)
[facility] - DISCHARGE INSTRUCTIONS TO PATIENT  Activity:  Driving - May drive in 1 to 3 days, if off pain meds   Lifting - No lifting more than 15 pounds for 5 days, then no lmiits                       Practice your Covid-19 protection:  Wear a mask, social distance, and wash your hands frequently  Wound Care:   Leave the incision dry for 2 days, then you may shower  Diet:  As tolerated  Follow up appointment:  Call Dr. Pollie Friar office [facility]) at [phone] for an appointment in 2 to 4 weeks..  Medications and dosages:  Resume your home medications.  You have a prescription for:  Ultram  Call Dr. Lucia Gaskins or his office  [phone]) if you have:  Temperature greater than 100.4,  Persistent nausea and vomiting,  Severe uncontrolled pain,  Redness, tenderness, or signs of infection (pain, swelling, redness, odor or green/yellow discharge around the site),  Difficulty breathing, headache or visual disturbances,  Any other questions or concerns you may have after discharge.  In an emergency, call 911 or go to an Emergency Department at a nearby hospital.     No Tylenol before 1:00pm.  No ibuprofen before 4:00pm.   Post Anesthesia Home Care Instructions  Activity: Get plenty of rest for the remainder of the day. A responsible individual must stay with you for 24 hours following the procedure.  For the next 24 hours, DO NOT: -Drive a car -Paediatric nurse -Drink alcoholic beverages -Take any medication unless instructed by your physician -Make any legal decisions or sign important papers.  Meals: Start with liquid foods such as gelatin or soup. Progress to regular foods as tolerated. Avoid greasy, spicy, heavy foods. If nausea and/or vomiting occur, drink only clear liquids until the nausea and/or vomiting subsides. Call your physician if vomiting continues.  Special Instructions/Symptoms: Your throat may feel dry or sore from the  anesthesia or the breathing tube placed in your throat during surgery. If this causes discomfort, gargle with warm salt water. The discomfort should disappear within 24 hours.  If you had a scopolamine patch placed behind your ear for the management of post- operative nausea and/or vomiting:  1. The medication in the patch is effective for 72 hours, after which it should be removed.  Wrap patch in a tissue and discard in the trash. Wash hands thoroughly with soap and water. 2. You may remove the patch earlier than 72 hours if you experience unpleasant side effects which may include dry mouth, dizziness or visual disturbances. 3. Avoid touching the patch. Wash your hands with soap and water after contact with the patch.

## 2019-06-07 NOTE — Op Note (Addendum)
06/07/2019  8:45 AM  PATIENT:  [name] DOB: 01/21/1961 MRN: [id number]  PREOP DIAGNOSIS:   LEFT BREAST MASS  POSTOP DIAGNOSIS:    Left breast abnormality, 10 o'clock position   PROCEDURE:   Procedure(s):  LEFT BREAST LUMPECTOMY WITH RADIOACTIVE SEED LOCALIZATION  SURGEON:   Alphonsa Overall, M.D.  ANESTHESIA:   General  Anesthesiologist: Freddrick March, MD CRNA: Gwyndolyn Saxon, CRNA  General  EBL:  minimal  ml  DRAINS:  none   LOCAL MEDICATIONS USED:   20 cc 1/4% marcaine  SPECIMEN:   Left breast mass (painted with 6 color paint kit)  COUNTS CORRECT:  YES  INDICATIONS FOR PROCEDURE:  [name] is a 58 y.o. (DOB: 03/17/1961) white female whose primary care physician is Jinny Sanders, MD and comes for left breast lumpectomy.   She had a left breast biopsy for an indistinct mass on 04/03/2019 which showed benign fibroadipose tissue.  This was felt to be discordant and she comes for excision of this area of her breast.    The indications and potential complications of surgery were explained to the patient. Potential complications include, but are not limited to, bleeding, infection, the need for further surgery, and nerve injury.     She had a I131 seed placed on 06/06/2019 in her left breast at [facility].  The seed is in the 10 o'clock position of the left breast underneath an old scar.     OPERATIVE NOTE:   The patient was taken to operating room # 8 at [facility] where she underwent a general anesthesia  supervised by Anesthesiologist: Freddrick March, MD CRNA: Gwyndolyn Saxon, CRNA. Her left breast and axilla were prepped with  ChloraPrep and sterilely draped.    A time-out was held and the surgical check list was reviewed.    The abnormality was about at the 10 o'clock position of the left breast.  She had an old scar immediately over the area, so I used this scar as my incision.   I used the Neoprobe to identify the I131 seed.   I tried to excise an area around the abnormality.    I excised this block of breast tissue approximately 3 cm by 3 cm  in diameter.   The dissection was carried down to the pectoralis muscle, but I did not see the implant.   I painted the lumpectomy specimen with the 6 color paint kit and did a specimen mammogram which confirmed the mass, clip, and the seed were all in the right position in the specimen.  The specimen was sent to pathology who called back to confirm that they have the seed and the specimen.   I then irrigated the wound with saline. I infiltrated approximately 30 mL of 1/4% Marcaine between the incisions.  I then closed all the wounds in layers using 3-0 Vicryl sutures for the deep layer. At the skin, I closed the incisions with a 4-0 Monocryl suture. The incisions were then painted with Dermabond.  She had gauze place over the wounds and placed in a breast binder.   The patient tolerated the procedure well, was transported to the recovery room in good condition. Sponge and needle count were correct at the end of the case.   Final pathology is pending.   Left breast lumpectomy   Alphonsa Overall, MD, [facility] Pager: [phone] Office phone:  [phone]

## 2019-06-07 NOTE — Anesthesia Procedure Notes (Signed)
Procedure Name: LMA Insertion Date/Time: 06/07/2019 7:42 AM Performed by: Gwyndolyn Saxon, CRNA Pre-anesthesia Checklist: Patient identified, Emergency Drugs available, Suction available and Patient being monitored Patient Re-evaluated:Patient Re-evaluated prior to induction Oxygen Delivery Method: Circle system utilized Preoxygenation: Pre-oxygenation with 100% oxygen Induction Type: IV induction Ventilation: Mask ventilation without difficulty LMA: LMA inserted LMA Size: 3.0 Number of attempts: 1 Placement Confirmation: positive ETCO2 and breath sounds checked- equal and bilateral Tube secured with: Tape Dental Injury: Teeth and Oropharynx as per pre-operative assessment

## 2019-06-07 NOTE — Transfer of Care (Signed)
Immediate Anesthesia Transfer of Care Note  Patient: [name]  Procedure(s) Performed: LEFT BREAST LUMPECTOMY WITH RADIOACTIVE SEED LOCALIZATION (Left Breast)  Patient Location: PACU  Anesthesia Type:General  Level of Consciousness: drowsy and patient cooperative  Airway & Oxygen Therapy: Patient Spontanous Breathing and Patient connected to face mask oxygen  Post-op Assessment: Report given to RN and Post -op Vital signs reviewed and stable  Post vital signs: Reviewed and stable  Last Vitals:  Vitals Value Taken Time  BP 135/70 06/07/19 0841  Temp    Pulse 99 06/07/19 0844  Resp 22 06/07/19 0844  SpO2 100 % 06/07/19 0844  Vitals shown include unvalidated device data.  Last Pain:  Vitals:   06/07/19 0630  TempSrc: Oral  PainSc: 0-No pain      Patients Stated Pain Goal: 5 ([id number])  Complications: No apparent anesthesia complications

## 2019-06-07 NOTE — Interval H&P Note (Signed)
History and Physical Interval Note:  06/07/2019 7:20 AM  [name]  has presented today for surgery, with the diagnosis of LEFT BREAST MASS.  The various methods of treatment have been discussed with the patient and family.   Her mother, Ripley Fraise, is here with her.  After consideration of risks, benefits and other options for treatment, the patient has consented to  Procedure(s): LEFT BREAST LUMPECTOMY WITH RADIOACTIVE SEED LOCALIZATION (Left) as a surgical intervention.  The patient's history has been reviewed, patient examined, no change in status, stable for surgery.  I have reviewed the patient's chart and labs.  Questions were answered to the patient's satisfaction.     Shann Medal

## 2019-06-08 NOTE — Anesthesia Postprocedure Evaluation (Signed)
Anesthesia Post Note  Patient: Moreen Cranmer  Procedure(s) Performed: LEFT BREAST LUMPECTOMY WITH RADIOACTIVE SEED LOCALIZATION (Left Breast)     Patient location during evaluation: PACU Anesthesia Type: General Level of consciousness: awake and alert Pain management: pain level controlled Vital Signs Assessment: post-procedure vital signs reviewed and stable Respiratory status: spontaneous breathing, nonlabored ventilation, respiratory function stable and patient connected to nasal cannula oxygen Cardiovascular status: blood pressure returned to baseline and stable Postop Assessment: no apparent nausea or vomiting Anesthetic complications: no    Last Vitals:  Vitals:   06/07/19 0927 06/07/19 0943  BP: 130/80 132/76  Pulse: 76 76  Resp: 13 16  Temp:  36.6 C  SpO2: 99%     Last Pain:  Vitals:   06/07/19 0943  TempSrc: Oral  PainSc: 0-No pain                 Maily Debarge L Kalista Laguardia

## 2019-06-11 ENCOUNTER — Encounter (HOSPITAL_BASED_OUTPATIENT_CLINIC_OR_DEPARTMENT_OTHER): Payer: Self-pay | Admitting: Surgery

## 2019-06-11 LAB — SURGICAL PATHOLOGY

## 2019-09-21 ENCOUNTER — Telehealth: Payer: Self-pay

## 2019-09-21 NOTE — Telephone Encounter (Signed)
LVM to call clinic, pt needs COVID screen, front door and back lab info 3.5.2021 TLJ

## 2019-09-25 ENCOUNTER — Other Ambulatory Visit: Payer: Self-pay

## 2019-09-25 ENCOUNTER — Telehealth: Payer: Self-pay | Admitting: Family Medicine

## 2019-09-25 ENCOUNTER — Other Ambulatory Visit (INDEPENDENT_AMBULATORY_CARE_PROVIDER_SITE_OTHER): Payer: BC Managed Care – PPO

## 2019-09-25 DIAGNOSIS — E7800 Pure hypercholesterolemia, unspecified: Secondary | ICD-10-CM | POA: Diagnosis not present

## 2019-09-25 LAB — COMPREHENSIVE METABOLIC PANEL
ALT: 15 U/L (ref 0–35)
AST: 21 U/L (ref 0–37)
Albumin: 4 g/dL (ref 3.5–5.2)
Alkaline Phosphatase: 74 U/L (ref 39–117)
BUN: 11 mg/dL (ref 6–23)
CO2: 30 mEq/L (ref 19–32)
Calcium: 9.3 mg/dL (ref 8.4–10.5)
Chloride: 105 mEq/L (ref 96–112)
Creatinine, Ser: 0.74 mg/dL (ref 0.40–1.20)
GFR: 80.79 mL/min (ref >60.00)
Glucose, Bld: 96 mg/dL (ref 70–99)
Potassium: 4.3 mEq/L (ref 3.5–5.1)
Sodium: 140 mEq/L (ref 135–145)
Total Bilirubin: 0.6 mg/dL (ref 0.2–1.2)
Total Protein: 6.5 g/dL (ref 6.0–8.3)

## 2019-09-25 LAB — LIPID PANEL
Cholesterol: 197 mg/dL (ref 0–200)
HDL: 50.4 mg/dL (ref >39.00)
LDL Cholesterol: 127 mg/dL — ABNORMAL HIGH (ref 0–99)
NonHDL: 146.79
Total CHOL/HDL Ratio: 4
Triglycerides: 98 mg/dL (ref 0.0–149.0)
VLDL: 19.6 mg/dL (ref 0.0–40.0)

## 2019-09-25 NOTE — Telephone Encounter (Signed)
-----   Message from Cloyd Stagers, RT sent at 09/13/2019  2:54 PM EST ----- Regarding: Lab Orders for Tuesday 3.9.2021 Please place lab orders for Tuesday 3.9.2021, office visit for physical on Tuesday 3.16.2021 Thank you, Dyke Maes RT(R)

## 2019-09-25 NOTE — Progress Notes (Signed)
No critical labs need to be addressed urgently. We will discuss labs in detail at upcoming office visit.   

## 2019-10-02 ENCOUNTER — Encounter: Payer: BLUE CROSS/BLUE SHIELD | Admitting: Family Medicine

## 2019-10-02 ENCOUNTER — Encounter: Payer: Self-pay | Admitting: Family Medicine

## 2019-10-02 ENCOUNTER — Ambulatory Visit (INDEPENDENT_AMBULATORY_CARE_PROVIDER_SITE_OTHER): Payer: BC Managed Care – PPO | Admitting: Family Medicine

## 2019-10-02 ENCOUNTER — Other Ambulatory Visit: Payer: Self-pay

## 2019-10-02 VITALS — BP 110/80 | HR 68 | Temp 97.2°F | Ht 62.25 in | Wt 147.5 lb

## 2019-10-02 DIAGNOSIS — R0789 Other chest pain: Secondary | ICD-10-CM

## 2019-10-02 DIAGNOSIS — Z85820 Personal history of malignant melanoma of skin: Secondary | ICD-10-CM

## 2019-10-02 DIAGNOSIS — Z0000 Encounter for general adult medical examination without abnormal findings: Secondary | ICD-10-CM | POA: Diagnosis not present

## 2019-10-02 DIAGNOSIS — E7800 Pure hypercholesterolemia, unspecified: Secondary | ICD-10-CM | POA: Diagnosis not present

## 2019-10-02 DIAGNOSIS — Z853 Personal history of malignant neoplasm of breast: Secondary | ICD-10-CM

## 2019-10-02 NOTE — Assessment & Plan Note (Signed)
Having 6 month skin checks.

## 2019-10-02 NOTE — Assessment & Plan Note (Signed)
Well controlled. Continue current medication.  

## 2019-10-02 NOTE — Progress Notes (Signed)
Chief Complaint  Patient presents with  . Annual Exam    History of Present Illness: HPI  The patient is here for annual wellness exam and preventative care.    Elevated Cholesterol: Cholesterol at goal on statin.Takes every 2-3 days.  The 10-year ASCVD risk score Mikey Bussing DC Brooke Bonito., et al., 2013) is: 1.9%   Values used to calculate the score:     Age: 59 years     Sex: Female     Is Non-Hispanic African American: No     Diabetic: No     Tobacco smoker: No     Systolic Blood Pressure: [id number] mmHg     Is BP treated: No     HDL Cholesterol: 50.4 mg/dL     Total Cholesterol: 197 mg/dL  Lab Results  Component Value Date   CHOL 197 09/25/2019   HDL 50.40 09/25/2019   LDLCALC 127 (H) 09/25/2019   LDLDIRECT 168.3 07/15/2008   TRIG 98.0 09/25/2019   CHOLHDL 4 09/25/2019  Using medications without problems:none Muscle aches: none Diet compliance: healthy low fat Exercise:walking on treadmill daily for 30 min. Other complaints:   No SOB, no CP.  Denies anxiety.  Hx of breast cancer  recently: She felt a nodule in the scar of her prior left breast lumpectomy scar. She had a mammogram on 03/29/2019 which showed a 10 x 6 mm indistinct mass at 11 o'clock in the left breast. She underwent a left breast biopsy [id number] on 04/03/2019 at 11 o'clock which showed benign fibroadipose tissue.   Under went lumpectomy with Dr. Lucia Gaskins.. benign tissue found at site of seed.  Area is still sore.  Dx with melanoma.. removed from chest 1 year ago.  This visit occurred during the SARS-CoV-2 public health emergency.  Safety protocols were in place, including screening questions prior to the visit, additional usage of staff PPE, and extensive cleaning of exam room while observing appropriate contact time as indicated for disinfecting solutions.   COVID 19 screen:  No recent travel or known exposure to COVID19 The patient denies respiratory symptoms of COVID 19 at this time. The importance of  social distancing was discussed today.     Review of Systems  Constitutional: Negative for chills and fever.  HENT: Negative for congestion and ear pain.   Eyes: Negative for pain and redness.  Respiratory: Negative for cough and shortness of breath.   Cardiovascular: Negative for chest pain, palpitations and leg swelling.  Gastrointestinal: Negative for abdominal pain, blood in stool, constipation, diarrhea, nausea and vomiting.  Genitourinary: Negative for dysuria.  Musculoskeletal: Negative for falls and myalgias.  Skin: Negative for rash.  Neurological: Negative for dizziness.  Psychiatric/Behavioral: Negative for depression. The patient is not nervous/anxious.       Past Medical History:  Diagnosis Date  . Breast cancer ([place])   . Hyperlipidemia   . Osteopenia   . Personal history of radiation therapy 2008    reports that she has never smoked. She has never used smokeless tobacco. She reports that she does not drink alcohol or use drugs.   Current Outpatient Medications:  .  Calcium-Phosphorus-Vitamin D (CALCIUM/D3 ADULT GUMMIES PO), Take 2-3 each by mouth daily. Vitafusion, Disp: , Rfl:  .  simvastatin (ZOCOR) 40 MG tablet, TAKE 1 TABLET BY MOUTH EVERY OTHER NIGHT, Disp: 45 tablet, Rfl: 2   Observations/Objective: Blood pressure 110/80, pulse 68, temperature (!) 97.2 F (36.2 C), temperature source Temporal, height 5' 2.25" (1.581 m), weight 147 lb 8 oz (66.9 kg),  SpO2 99 %. Body mass index is 26.76 kg/m.  Physical Exam Constitutional:      General: She is not in acute distress.    Appearance: Normal appearance. She is well-developed. She is not ill-appearing or toxic-appearing.  HENT:     Head: Normocephalic.     Right Ear: Hearing, tympanic membrane, ear canal and external ear normal.     Left Ear: Hearing, tympanic membrane, ear canal and external ear normal.     Nose: Nose normal.  Eyes:     General: Lids are normal. Lids are everted, no foreign bodies  appreciated.     Conjunctiva/sclera: Conjunctivae normal.     Pupils: Pupils are equal, round, and reactive to light.  Neck:     Thyroid: No thyroid mass or thyromegaly.     Vascular: No carotid bruit.     Trachea: Trachea normal.  Cardiovascular:     Rate and Rhythm: Normal rate and regular rhythm.     Heart sounds: Normal heart sounds, S1 normal and S2 normal. No murmur. No gallop.   Pulmonary:     Effort: Pulmonary effort is normal. No respiratory distress.     Breath sounds: Normal breath sounds. No wheezing, rhonchi or rales.  Chest:     Comments: Area of tenderness in left upper chest wall with palpation  Abdominal:     General: Bowel sounds are normal. There is no distension or abdominal bruit.     Palpations: Abdomen is soft. There is no fluid wave or mass.     Tenderness: There is no abdominal tenderness. There is no guarding or rebound.     Hernia: No hernia is present.  Musculoskeletal:     Cervical back: Normal range of motion and neck supple.  Lymphadenopathy:     Cervical: No cervical adenopathy.  Skin:    General: Skin is warm and dry.     Findings: No rash.  Neurological:     Mental Status: She is alert.     Cranial Nerves: No cranial nerve deficit.     Sensory: No sensory deficit.  Psychiatric:        Mood and Affect: Mood is not anxious or depressed.        Speech: Speech normal.        Behavior: Behavior normal. Behavior is cooperative.        Judgment: Judgment normal.      Assessment and Plan The patient's preventative maintenance and recommended screening tests for an annual wellness exam were reviewed in full today. Brought up to date unless services declined.  Counselled on the importance of diet, exercise, and its role in overall health and mortality. The patient's FH and SH was reviewed, including their home life, tobacco status, and drug and alcohol status.   Continue DVE yearly given uterus still present. S/P BSO, Ovary evalnot indicated.  Has cervix PAPs every5years, last nml 2020, neg. HPVMammo: 2008 breast cancer, per ONC  nml 12/2018 DEXA: 10/2012: osteopenia (but was high risk since on Arimidex, now off arimidex for 3 years, shewas onfosamax ( was on for almost 2 years) to prevent deterioration, but has stopped now) Stable DEXA in 11/2014 osteopenia,normal in 2018 and 2020 Vaccines: uptodate with tetanus. Refused HIV screen. Hep C screen: done Colonoscopy 11/2007.. Dr. Fuller Plan.   History of melanoma Having 6 month skin checks.  HYPERCHOLESTEROLEMIA Well controlled. Continue current medication.   History of breast cancer, left  Upper chest wall stretching.  Consider following up with implant surgeon  for eval of implants.     Eliezer Lofts, MD

## 2019-10-02 NOTE — Patient Instructions (Addendum)
Call Dr. Fuller Plan to set up colon cancer screening.  Upper chest wall stretching.  Consider following up with implant surgeon.

## 2019-10-10 DIAGNOSIS — R0789 Other chest pain: Secondary | ICD-10-CM | POA: Insufficient documentation

## 2019-10-10 NOTE — Assessment & Plan Note (Signed)
Upper chest wall stretching.  Consider following up with implant surgeon for eval of implants.

## 2019-11-29 ENCOUNTER — Other Ambulatory Visit: Payer: Self-pay | Admitting: Family Medicine

## 2019-11-29 DIAGNOSIS — Z1231 Encounter for screening mammogram for malignant neoplasm of breast: Secondary | ICD-10-CM

## 2020-01-04 ENCOUNTER — Ambulatory Visit
Admission: RE | Admit: 2020-01-04 | Discharge: 2020-01-04 | Disposition: A | Discharge status: Home / Self Care | Payer: BC Managed Care – PPO | Source: Ambulatory Visit | Attending: Family Medicine | Admitting: Family Medicine

## 2020-01-04 ENCOUNTER — Other Ambulatory Visit: Payer: Self-pay

## 2020-01-04 DIAGNOSIS — Z1231 Encounter for screening mammogram for malignant neoplasm of breast: Secondary | ICD-10-CM

## 2020-02-13 ENCOUNTER — Encounter: Payer: BC Managed Care – PPO | Admitting: Dermatology

## 2020-02-18 ENCOUNTER — Other Ambulatory Visit: Payer: Self-pay | Admitting: Family Medicine

## 2020-03-05 ENCOUNTER — Encounter: Payer: BC Managed Care – PPO | Admitting: Dermatology

## 2020-03-17 ENCOUNTER — Inpatient Hospital Stay: Payer: BC Managed Care – PPO | Admitting: Nurse Practitioner

## 2020-03-17 ENCOUNTER — Encounter: Payer: Self-pay | Admitting: Nurse Practitioner

## 2020-03-17 ENCOUNTER — Inpatient Hospital Stay: Payer: BC Managed Care – PPO | Attending: Nurse Practitioner

## 2020-03-17 ENCOUNTER — Inpatient Hospital Stay: Payer: BC Managed Care – PPO

## 2020-03-17 ENCOUNTER — Other Ambulatory Visit: Payer: Self-pay

## 2020-03-17 VITALS — BP 123/71 | HR 84 | Temp 99.3°F | Resp 18 | Ht 62.25 in | Wt 147.8 lb

## 2020-03-17 DIAGNOSIS — M85852 Other specified disorders of bone density and structure, left thigh: Secondary | ICD-10-CM | POA: Diagnosis not present

## 2020-03-17 DIAGNOSIS — Z853 Personal history of malignant neoplasm of breast: Secondary | ICD-10-CM

## 2020-03-17 DIAGNOSIS — Z923 Personal history of irradiation: Secondary | ICD-10-CM | POA: Diagnosis not present

## 2020-03-17 DIAGNOSIS — M8580 Other specified disorders of bone density and structure, unspecified site: Secondary | ICD-10-CM

## 2020-03-17 DIAGNOSIS — Z9223 Personal history of estrogen therapy: Secondary | ICD-10-CM | POA: Insufficient documentation

## 2020-03-17 DIAGNOSIS — Z79899 Other long term (current) drug therapy: Secondary | ICD-10-CM | POA: Insufficient documentation

## 2020-03-17 DIAGNOSIS — Z170 Estrogen receptor positive status [ER+]: Secondary | ICD-10-CM | POA: Diagnosis not present

## 2020-03-17 LAB — CBC WITH DIFFERENTIAL (CANCER CENTER ONLY)
Abs Immature Granulocytes: 0.01 10*3/uL (ref 0.00–0.07)
Basophils Absolute: 0 10*3/uL (ref 0.0–0.1)
Basophils Relative: 0 %
Eosinophils Absolute: 0.1 10*3/uL (ref 0.0–0.5)
Eosinophils Relative: 1 %
HCT: 40.2 % (ref 36.0–46.0)
Hemoglobin: 13.1 g/dL (ref 12.0–15.0)
Immature Granulocytes: 0 %
Lymphocytes Relative: 25 %
Lymphs Abs: 1.6 10*3/uL (ref 0.7–4.0)
MCH: 28.7 pg (ref 26.0–34.0)
MCHC: 32.6 g/dL (ref 30.0–36.0)
MCV: 88 fL (ref 80.0–100.0)
Monocytes Absolute: 0.5 10*3/uL (ref 0.1–1.0)
Monocytes Relative: 7 %
Neutro Abs: 4 10*3/uL (ref 1.7–7.7)
Neutrophils Relative %: 67 %
Platelet Count: 248 10*3/uL (ref 150–400)
RBC: 4.57 MIL/uL (ref 3.87–5.11)
RDW: 13.3 % (ref 11.5–15.5)
WBC Count: 6.1 10*3/uL (ref 4.0–10.5)
nRBC: 0 % (ref 0.0–0.2)

## 2020-03-17 LAB — CMP (CANCER CENTER ONLY)
ALT: 16 U/L (ref 0–44)
AST: 23 U/L (ref 15–41)
Albumin: 3.7 g/dL (ref 3.5–5.0)
Alkaline Phosphatase: 82 U/L (ref 38–126)
Anion gap: 7 (ref 5–15)
BUN: 11 mg/dL (ref 6–20)
CO2: 29 mmol/L (ref 22–32)
Calcium: 9.9 mg/dL (ref 8.9–10.3)
Chloride: 104 mmol/L (ref 98–111)
Creatinine: 0.8 mg/dL (ref 0.44–1.00)
GFR, Est AFR Am: >60 mL/min (ref >60)
GFR, Estimated: >60 mL/min (ref >60)
Glucose, Bld: 99 mg/dL (ref 70–99)
Potassium: 3.9 mmol/L (ref 3.5–5.1)
Sodium: 140 mmol/L (ref 135–145)
Total Bilirubin: 0.6 mg/dL (ref 0.3–1.2)
Total Protein: 7.1 g/dL (ref 6.5–8.1)

## 2020-03-17 LAB — VITAMIN D 25 HYDROXY (VIT D DEFICIENCY, FRACTURES): Vit D, 25-Hydroxy: 44.67 ng/mL (ref 30–100)

## 2020-03-17 NOTE — Progress Notes (Signed)
[name]   [phone] [phone]   Clinic Follow up Note   Patient Care Team: Jinny Sanders, MD as PCP - General 03/17/2020  CHIEF COMPLAINT: Follow-up history of left breast cancer  SUMMARY OF ONCOLOGIC HISTORY: Oncology History Overview Note  Breast cancer of upper-outer quadrant of left female breast   Staging form: Breast, AJCC 7th Edition     Pathologic: Stage IA (T1b, N0, cM0) - Unsigned      History of breast cancer, left  05/2006 Initial Diagnosis   Breast cancer of upper-outer quadrant of left female breast   05/2006 Initial Biopsy    left breast mass biopsy showed invasive ductal carcinoma, the biopsy was done in [facility]   05/2006 Receptors her2   ER positive, PR positive, HER-2 negative   05/2006 Imaging   Mammogram and ultrasound showed a 8 mm mass in the left breast 10:00 position   10/11/2006 Pathology Results    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 0 cm, 2 sentinel lymph nodes negative, (+)  DCIS   10/11/2006 Surgery   Left lumpectomy with negative margins.   11/22/2006 - 12/24/2006 Radiation Therapy    left breast irradiation, under Dr. [name]    02/2007 Surgery    bilateral salpingo-oophorectomy    03/2007 - 12/2012 Anti-estrogen oral therapy   adjuvant Arimidex $RemoveBeforeDEI'1mg'hJeAbGeqTqeRxEIe$  daily    11/28/2017 Mammogram   11/28/2017 Mammogram IMPRESSION: No mammographic evidence of malignancy. A result letter of this screening mammogram will be mailed directly to the patient.   01/02/2019 Mammogram   IMPRESSION: No mammographic evidence of malignancy.   03/29/2019 Breast US   Targeted ultrasound is performed, showing an irregular slightly hypoechoic mass with an area of linear extension into the skin at 11 o'clock position 6 cm from the nipple. Findings are at the site of the palpable lump associated with the midportion of the lumpectomy scar. Although indistinct, the most focal portion of this  mass measures approximately 6 x 10 x 5 mm. Ultrasound of the left axilla is negative for lymphadenopathy.   04/03/2019 Pathology Results   Breast, left, needle core biopsy, 11 o'clock - BENIGN FIBROADIPOSE TISSUE. SEE NOTE - NEGATIVE FOR CARCINOMA   06/07/2019 Pathology Results   FINAL MICROSCOPIC DIAGNOSIS:  A. BREAST, LEFT W/SEED, LUMPECTOMY:  - Benign breast parenchyma with nodular hyaline fibrosis and dystrophic  calcifications  - No evidence of malignancy    01/04/2020 Mammogram   IMPRESSION: No mammographic evidence of malignancy.     CURRENT THERAPY: Surveillance  INTERVAL HISTORY: [name] returns for annual follow-up.  She was last seen by Dr. Burr Medico on 03/16/2019.  She had a left breast ultrasound on 03/29/2019 that showed an irregular slightly hypoechoic mass at the 11 o'clock position associated with the midportion of the lumpectomy scar measuring 6 x 10 x 5 mm.  Path on 04/03/2019 showed benign fibroadipose tissue but discordant.  She underwent lumpectomy on 06/07/2019 by Dr. Lucia Gaskins which showed benign breast tissue with nodular hyalin fibrosis and dystrophic calcifications, no evidence of malignancy.  Her screening mammogram on 01/04/2020 showed no evidence of malignancy.  Ms. [name] presents with her mother today.  She is doing well since last visit, no other changes in her health since then.  She continues to have left breast discomfort that is mostly positional, ongoing for over a year.  Denies new mass, nipple discharge or other breast concerns.  Denies changes in her appetite or energy.  Denies unintentional weight  loss, change in bowel habits, bleeding, fever, chills, cough, chest pain, dyspnea, bone pain, or other concerns.   MEDICAL HISTORY:  Past Medical History:  Diagnosis Date  . Breast cancer ([place])   . Hyperlipidemia   . Osteopenia   . Personal history of radiation therapy 2008    SURGICAL HISTORY: Past Surgical History:  Procedure Laterality Date  .  AUGMENTATION MAMMAPLASTY Bilateral 2001  . BREAST LUMPECTOMY Left 2008  . BREAST LUMPECTOMY WITH RADIOACTIVE SEED LOCALIZATION Left 06/07/2019   Procedure: LEFT BREAST LUMPECTOMY WITH RADIOACTIVE SEED LOCALIZATION;  Surgeon: Alphonsa Overall, MD;  Location: [place];  Service: General;  Laterality: Left;  . BREAST SURGERY  LEFT   LUMPECTOMY  . OOPHRECTOMY     BI LATERERAL FOR CANCER PREVENTON  . PILONIDAL CYST EXCISION      I have reviewed the social history and family history with the patient and they are unchanged from previous note.  ALLERGIES:  has No Known Allergies.  MEDICATIONS:  Current Outpatient Medications  Medication Sig Dispense Refill  . Calcium-Phosphorus-Vitamin D (CALCIUM/D3 ADULT GUMMIES PO) Take 2-3 each by mouth daily. Vitafusion    . simvastatin (ZOCOR) 40 MG tablet TAKE 1 TABLET BY MOUTH EVERY OTHER NIGHT 45 tablet 2   No current facility-administered medications for this visit.    PHYSICAL EXAMINATION: ECOG PERFORMANCE STATUS: 0 - Asymptomatic  Vitals:   03/17/20 1504  BP: 123/71  Pulse: 84  Resp: 18  Temp: 99.3 F (37.4 C)  SpO2: 100%   Filed Weights   03/17/20 1504  Weight: 147 lb 12.8 oz (67 kg)    GENERAL:alert, no distress and comfortable SKIN: No rash to exposed skin EYES:  sclera clear NECK: Without mass LYMPH:  no palpable cervical or supraclavicular lymphadenopathy LUNGS:  normal breathing effort HEART: no lower extremity edema NEURO: alert & oriented x 3 with fluent speech Breast exam: S/p left lumpectomy with reconstruction.  Incisions completely healed.  No nodularity or mass in either breast or axilla that I could appreciate  LABORATORY DATA:  I have reviewed the data as listed CBC Latest Ref Rng & Units 03/17/2020 03/16/2019 02/23/2018  WBC 4.0 - 10.5 K/uL 6.1 6.2 6.2  Hemoglobin 12.0 - 15.0 g/dL 13.1 13.2 12.9  Hematocrit 36 - 46 % 40.2 39.8 38.8  Platelets 150 - 400 K/uL 248 234 260     CMP Latest Ref Rng &  Units 09/25/2019 03/16/2019 09/22/2018  Glucose 70 - 99 mg/dL 96 92 85  BUN 6 - 23 mg/dL _0 Creatinine 0.40 - 1.20 mg/dL 0.74 0.81 0.75  Sodium 135 - 145 mEq/L 140 143 140  Potassium 3.5 - 5.1 mEq/L 4.3 3.7 3.8  Chloride 96 - 112 mEq/L 105 107 106  CO2 19 - 32 mEq/L _1 Calcium 8.4 - 10.5 mg/dL 9.3 8.8(L) 9.0  Total Protein 6.0 - 8.3 g/dL 6.5 6.9 6.8  Total Bilirubin 0.2 - 1.2 mg/dL 0.6 0.6 0.6  Alkaline Phos 39 - 117 U/L 74 77 67  AST 0 - 37 U/L _2 ALT 0 - 35 U/L _3 RADIOGRAPHIC STUDIES: I have personally reviewed the radiological images as listed and agreed with the findings in the report. No results found.   ASSESSMENT & PLAN: Stephanie Littman is a 59 y.o. female with   1. 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 She is s/p left lumpectomy and b/l reconstruction with implants, adjuvant radiation and 6 years of adjuvant Anastrozole.  -She has been on breast cancer surveillance since then -left breast ultrasound on 03/29/2019 showed an irregular slightly hypoechoic mass at the 11 o'clock position associated with the midportion of the lumpectomy scar measuring 6 x 10 x 5 mm.  Path on 04/03/2019 showed benign fibroadipose tissue but discordant.  She underwent lumpectomy on 06/07/2019 by Dr. Lucia Gaskins which showed benign breast tissue with nodular hyalin fibrosis and dystrophic calcifications, no evidence of malignancy. -Screening mammogram 12/2019 negative for malignancy -She is more than 10 years from her initial diagnosis, the risk of cancer recurrence is smaller now  2. Osteopenia -Her bone density got worse when she was on Arimidex. She was treated with Fosamax which was held in February 2016 due to her some dental issue, she was on Fosamax for 1.5 years and osteopenia resolved in 2018. -12/2018 DEXA shows osteopenia with lowest T-score -1 at left  femur neck. Mostly stable.  -Continue calcium and vitamin D, and weightbearing exercises  3. Skin cancer  -Per patient her dermatologist removed skin cancer from her left breast which was melanoma.  -She will continue to f/u with her Dermatologist.  -Strongly encouraged her to use sun protection   Disposition:  Ms. Llanas is clinically doing well.  Breast exam is benign.  The CBC is normal, we will follow-up on the pending CMP and vitamin D from today.  Mammogram in 12/2019 is negative for malignancy.  Overall no clinical concern for recurrence.  She prefers to continue breast cancer surveillance in our clinic with annual follow-up.  We reviewed signs and symptoms of recurrent/metastatic disease, she knows what to watch for.  I encouraged her to use sunscreen, remain active, eat a healthy diet, abstain from tobacco and avoid alcohol.  I encouraged her to remain up-to-date on age-appropriate cancer screenings, she is overdue for colonoscopy, she agrees to call Dr. Lynne Leader office to schedule.  Follow-up in 1 year    Orders Placed This Encounter  Procedures  . CBC with Differential (Cancer Center Only)    Standing Status:   Standing    Number of Occurrences:   5    Standing Expiration Date:   03/17/2021  . CMP ([place] only)    Standing Status:   Standing    Number of Occurrences:   5    Standing Expiration Date:   03/17/2021   All questions were answered. The patient knows to call the clinic with any problems, questions or concerns. No barriers to learning were detected.     Alla Feeling, NP 03/17/20

## 2020-03-18 ENCOUNTER — Telehealth: Payer: Self-pay | Admitting: Nurse Practitioner

## 2020-03-18 NOTE — Telephone Encounter (Signed)
Scheduled per 8/30 los. Mailing pt appt calendar.

## 2020-04-17 ENCOUNTER — Encounter: Payer: Self-pay | Admitting: Gastroenterology

## 2020-05-14 ENCOUNTER — Other Ambulatory Visit: Payer: Self-pay

## 2020-05-14 ENCOUNTER — Ambulatory Visit (INDEPENDENT_AMBULATORY_CARE_PROVIDER_SITE_OTHER): Payer: BC Managed Care – PPO | Admitting: Dermatology

## 2020-05-14 ENCOUNTER — Encounter: Payer: Self-pay | Admitting: Dermatology

## 2020-05-14 DIAGNOSIS — L578 Other skin changes due to chronic exposure to nonionizing radiation: Secondary | ICD-10-CM

## 2020-05-14 DIAGNOSIS — D225 Melanocytic nevi of trunk: Secondary | ICD-10-CM | POA: Diagnosis not present

## 2020-05-14 DIAGNOSIS — L821 Other seborrheic keratosis: Secondary | ICD-10-CM | POA: Diagnosis not present

## 2020-05-14 DIAGNOSIS — D229 Melanocytic nevi, unspecified: Secondary | ICD-10-CM

## 2020-05-14 DIAGNOSIS — Z1283 Encounter for screening for malignant neoplasm of skin: Secondary | ICD-10-CM | POA: Diagnosis not present

## 2020-05-14 DIAGNOSIS — D1800 Hemangioma unspecified site: Secondary | ICD-10-CM

## 2020-05-14 DIAGNOSIS — R590 Localized enlarged lymph nodes: Secondary | ICD-10-CM

## 2020-05-14 NOTE — Patient Instructions (Signed)
Recommend taking Heliocare sun protection supplement daily in sunny weather for additional sun protection. For maximum protection on the sunniest days, you can take up to 2 capsules of regular Heliocare OR take 1 capsule of Heliocare Ultra. For prolonged exposure (such as a full day in the sun), you can repeat your dose of the supplement 4 hours after your first dose. Heliocare can be purchased at [facility] or at [email or web address].       

## 2020-05-14 NOTE — Progress Notes (Signed)
   Follow-Up Visit   Subjective  [name] is a 59 y.o. female who presents for the following: TBSE.  She has noticed some tenderness at the chest when she lays down for many months.  It is lower than the excision site.  She would like to know if it could be related.   The following portions of the chart were reviewed this encounter and updated as appropriate: Tobacco  Allergies  Meds  Problems  Med Hx  Surg Hx  Fam Hx      Review of Systems: No other skin or systemic complaints except as noted in HPI or Assessment and Plan.   Objective  Well appearing patient in no apparent distress; mood and affect are within normal limits.  A full examination was performed including scalp, head, eyes, ears, nose, lips, neck, chest, axillae, abdomen, back, buttocks, bilateral upper extremities, bilateral lower extremities, hands, feet, fingers, toes, fingernails, and toenails. All findings within normal limits unless otherwise noted below.  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 Observe for recurrence. Call clinic for new or changing lesions.  Recommend regular skin exams, daily broad-spectrum spf 50+ sunscreen use, and photoprotection.    Seborrheic Keratoses - Stuck-on, waxy, tan-brown papules and plaques  - Discussed benign etiology and prognosis. - Observe - Call for any changes  Hemangiomas - Red papules - Discussed benign nature - Observe - Call for any changes  Actinic Damage - diffuse scaly erythematous macules with underlying dyspigmentation - Recommend daily broad spectrum sunscreen SPF 50+ to sun-exposed areas, reapply every 2 hours as needed.  - Call for new or changing lesions. - Sample given of Alastin hydratint pro mineral broad spectrum sunscreen SPF 36.   Melanocytic Nevi -  Tan-brown and/or pink-flesh-colored symmetric macules and papules - Benign appearing on exam today - Observation - Call clinic for new or changing moles - Recommend daily use of broad spectrum spf 30+ sunscreen to sun-exposed areas.   Lymphadenopathy - She has a <1 cm palpable lymph node at right inguinal fold.  It is not tender.  Recommend recheck in approximately 2 months with primary care or I am happy to recheck. Skin cancer screening performed today.  Return in about 6 months (around 11/12/2020) for TBSE.   I, Harriett Sine, CMA, am acting as scribe for Forest Gleason, MD.  Documentation: I have reviewed the above documentation for accuracy and completeness, and I agree with the above.  Forest Gleason, MD

## 2020-06-04 ENCOUNTER — Other Ambulatory Visit: Payer: Self-pay

## 2020-06-04 ENCOUNTER — Ambulatory Visit (AMBULATORY_SURGERY_CENTER): Payer: Self-pay | Admitting: *Deleted

## 2020-06-04 ENCOUNTER — Encounter: Payer: Self-pay | Admitting: Gastroenterology

## 2020-06-04 VITALS — Ht 62.25 in | Wt 146.0 lb

## 2020-06-04 DIAGNOSIS — Z1211 Encounter for screening for malignant neoplasm of colon: Secondary | ICD-10-CM

## 2020-06-04 MED ORDER — PLENVU 140 G PO SOLR: 1.0000 | ORAL | 0 refills | Status: DC

## 2020-06-04 NOTE — Progress Notes (Signed)
No egg or soy allergy known to patient  No issues with past sedation with any surgeries or procedures no intubation problems in the past  No FH of Malignant Hyperthermia No diet pills per patient No home 02 use per patient  No blood thinners per patient  Pt denies issues with constipation - Constipation is Occ- doesn't have to use OTC meds to have a BM- Typically has soft regular BM's just not every day  No A fib or A flutter  EMMI video to pt or via [place] 19 guidelines implemented in PV today with Pt and RN   Plenvu  Coupon given to pt in PV today , Code to Pharmacy   Due to the COVID-19 pandemic we are asking patients to follow these guidelines. Please only bring one care partner. Please be aware that your care partner may wait in the car in the parking lot or if they feel like they will be too hot to wait in the car, they may wait in the lobby on the 4th floor. All care partners are required to wear a mask the entire time (we do not have any that we can provide them), they need to practice social distancing, and we will do a Covid check for all patient's and care partners when you arrive. Also we will check their temperature and your temperature. If the care partner waits in their car they need to stay in the parking lot the entire time and we will call them on their cell phone when the patient is ready for discharge so they can bring the car to the front of the building. Also all patient's will need to wear a mask into building.

## 2020-06-16 ENCOUNTER — Ambulatory Visit (AMBULATORY_SURGERY_CENTER): Payer: BC Managed Care – PPO | Admitting: Gastroenterology

## 2020-06-16 ENCOUNTER — Encounter: Payer: Self-pay | Admitting: Gastroenterology

## 2020-06-16 ENCOUNTER — Other Ambulatory Visit: Payer: Self-pay

## 2020-06-16 VITALS — BP 120/64 | HR 63 | Temp 97.3°F | Resp 19 | Ht 62.0 in | Wt 146.0 lb

## 2020-06-16 DIAGNOSIS — Z1211 Encounter for screening for malignant neoplasm of colon: Secondary | ICD-10-CM | POA: Diagnosis not present

## 2020-06-16 DIAGNOSIS — D123 Benign neoplasm of transverse colon: Secondary | ICD-10-CM

## 2020-06-16 MED ORDER — SODIUM CHLORIDE 0.9 % IV SOLN: 500.0000 mL | Freq: Once | INTRAVENOUS | Status: DC

## 2020-06-16 NOTE — Op Note (Signed)
[facility] Patient Name: [name] Procedure Date: 06/16/2020 1:29 PM MRN: [id number] Endoscopist: Ladene Artist , MD Age: 59 Referring MD:  Date of Birth: [birth year] Gender: Female Account #: [id number] Procedure:                Colonoscopy Indications:              Screening for colorectal malignant neoplasm Medicines:                Monitored Anesthesia Care Procedure:                Pre-Anesthesia Assessment:                           - Prior to the procedure, a History and Physical                            was performed, and patient medications and                            allergies were reviewed. The patient's tolerance of                            previous anesthesia was also reviewed. The risks                            and benefits of the procedure and the sedation                            options and risks were discussed with the patient.                            All questions were answered, and informed consent                            was obtained. Prior Anticoagulants: The patient has                            taken no previous anticoagulant or antiplatelet                            agents. ASA Grade Assessment: II - A patient with                            mild systemic disease. After reviewing the risks                            and benefits, the patient was deemed in                            satisfactory condition to undergo the procedure.                           After obtaining informed consent, the colonoscope  was passed under direct vision. Throughout the                            procedure, the patient's blood pressure, pulse, and                            oxygen saturations were monitored continuously. The                            Colonoscope was introduced through the anus and                            advanced to the the cecum, identified by                            appendiceal orifice  and ileocecal valve. The                            ileocecal valve, appendiceal orifice, and rectum                            were photographed. The quality of the bowel                            preparation was excellent. The colonoscopy was                            performed without difficulty. The patient tolerated                            the procedure well. Scope In: 1:37:47 PM Scope Out: 1:51:39 PM Scope Withdrawal Time: 0 hours 10 minutes 57 seconds  Total Procedure Duration: 0 hours 13 minutes 52 seconds  Findings:                 The perianal and digital rectal examinations were                            normal.                           A 6 mm polyp was found in the transverse colon. The                            polyp was sessile. The polyp was removed with a                            cold snare. Resection and retrieval were complete.                           Internal hemorrhoids were found during                            retroflexion. The hemorrhoids were small and Grade  I (internal hemorrhoids that do not prolapse).                           The exam was otherwise without abnormality on                            direct and retroflexion views. Complications:            No immediate complications. Estimated blood loss:                            None. Estimated Blood Loss:     Estimated blood loss: none. Impression:               - One 6 mm polyp in the transverse colon, removed                            with a cold snare. Resected and retrieved.                           - Internal hemorrhoids.                           - The examination was otherwise normal on direct                            and retroflexion views. Recommendation:           - Repeat colonoscopy date to be determined after                            pending pathology results are reviewed for                            surveillance based on pathology results.                            - Patient has a contact number available for                            emergencies. The signs and symptoms of potential                            delayed complications were discussed with the                            patient. Return to normal activities tomorrow.                            Written discharge instructions were provided to the                            patient.                           - Resume previous diet.                           -  Continue present medications.                           - Await pathology results. Ladene Artist, MD 06/16/2020 1:54:53 PM This report has been signed electronically.

## 2020-06-16 NOTE — Progress Notes (Signed)
Riki Sheer LPN vital signs.

## 2020-06-16 NOTE — Patient Instructions (Signed)
Handouts given:   Polyps, hemorrhoids Resume previous diet Continue current medications Await pathlogy results  YOU HAD AN ENDOSCOPIC PROCEDURE TODAY AT [facility]:   Refer to the procedure report that was given to you for any specific questions about what was found during the examination.  If the procedure report does not answer your questions, please call your gastroenterologist to clarify.  If you requested that your care partner not be given the details of your procedure findings, then the procedure report has been included in a sealed envelope for you to review at your convenience later.  YOU SHOULD EXPECT: Some feelings of bloating in the abdomen. Passage of more gas than usual.  Walking can help get rid of the air that was put into your GI tract during the procedure and reduce the bloating. If you had a lower endoscopy (such as a colonoscopy or flexible sigmoidoscopy) you may notice spotting of blood in your stool or on the toilet paper. If you underwent a bowel prep for your procedure, you may not have a normal bowel movement for a few days.  Please Note:  You might notice some irritation and congestion in your nose or some drainage.  This is from the oxygen used during your procedure.  There is no need for concern and it should clear up in a day or so.  SYMPTOMS TO REPORT IMMEDIATELY:   Following lower endoscopy (colonoscopy or flexible sigmoidoscopy):  Excessive amounts of blood in the stool  Significant tenderness or worsening of abdominal pains  Swelling of the abdomen that is new, acute  Fever of 100F or higher  For urgent or emergent issues, a gastroenterologist can be reached at any hour by calling [phone]. Do not use MyChart messaging for urgent concerns.   DIET:  We do recommend a small meal at first, but then you may proceed to your regular diet.  Drink plenty of fluids but you should avoid alcoholic beverages for 24 hours.  ACTIVITY:  You should  plan to take it easy for the rest of today and you should NOT DRIVE or use heavy machinery until tomorrow (because of the sedation medicines used during the test).    FOLLOW UP: Our staff will call the number listed on your records 48-72 hours following your procedure to check on you and address any questions or concerns that you may have regarding the information given to you following your procedure. If we do not reach you, we will leave a message.  We will attempt to reach you two times.  During this call, we will ask if you have developed any symptoms of COVID 19. If you develop any symptoms (ie: fever, flu-like symptoms, shortness of breath, cough etc.) before then, please call [phone].  If you test positive for Covid 19 in the 2 weeks post procedure, please call and report this information to [place].    If any biopsies were taken you will be contacted by phone or by letter within the next 1-3 weeks.  Please call us at [phone] if you have not heard about the biopsies in 3 weeks.   SIGNATURES/CONFIDENTIALITY: You and/or your care partner have signed paperwork which will be entered into your electronic medical record.  These signatures attest to the fact that that the information above on your After Visit Summary has been reviewed and is understood.  Full responsibility of the confidentiality of this discharge information lies with you and/or your care-partner.

## 2020-06-16 NOTE — Progress Notes (Signed)
Called to room to assist during endoscopic procedure.  Patient ID and intended procedure confirmed with present staff. Received instructions for my participation in the procedure from the performing physician.  

## 2020-06-16 NOTE — Progress Notes (Signed)
Report to PACU, RN, vss, BBS= Clear.  

## 2020-06-16 NOTE — Progress Notes (Signed)
Pt's states no medical or surgical changes since previsit or office visit. 

## 2020-06-18 ENCOUNTER — Telehealth: Payer: Self-pay | Admitting: *Deleted

## 2020-06-18 ENCOUNTER — Encounter: Payer: BC Managed Care – PPO | Admitting: Gastroenterology

## 2020-06-18 ENCOUNTER — Telehealth: Payer: Self-pay

## 2020-06-18 NOTE — Telephone Encounter (Signed)
  Follow up Call-  Call back number 06/16/2020  Post procedure Call Back phone  # [phone]  Permission to leave phone message Yes  Some recent data might be hidden     Patient questions:  Do you have a fever, pain , or abdominal swelling? No. Pain Score  0 *  Have you tolerated food without any problems? Yes.    Have you been able to return to your normal activities? Yes.    Do you have any questions about your discharge instructions: Diet   No. Medications  No. Follow up visit  No.  Do you have questions or concerns about your Care? No.  Actions: * If pain score is 4 or above: No action needed, pain <4.  Have you developed a fever since your procedure? No 2.   Have you had an respiratory symptoms (SOB or cough) since your procedure? No  3.   Have you tested positive for COVID 19 since your procedure No  4.   Have you had any family members/close contacts diagnosed with the COVID 19 since your procedure?  No   If yes to any of these questions please route to Joylene John, RN and Joella Prince, RN

## 2020-06-18 NOTE — Telephone Encounter (Signed)
Left message on f/u call 

## 2020-06-26 ENCOUNTER — Encounter: Payer: Self-pay | Admitting: Gastroenterology

## 2020-08-26 ENCOUNTER — Other Ambulatory Visit: Payer: Self-pay | Admitting: Family Medicine

## 2020-08-26 DIAGNOSIS — Z1231 Encounter for screening mammogram for malignant neoplasm of breast: Secondary | ICD-10-CM

## 2020-09-17 ENCOUNTER — Telehealth: Payer: Self-pay | Admitting: Family Medicine

## 2020-09-17 DIAGNOSIS — E7800 Pure hypercholesterolemia, unspecified: Secondary | ICD-10-CM

## 2020-09-17 NOTE — Telephone Encounter (Signed)
-----   Message from Ellamae Sia sent at 09/08/2020 11:04 AM EST ----- Regarding: Lab orders for Friday, 3.11.22 Patient is scheduled for CPX labs, please order future labs, Thanks , Karna Christmas

## 2020-09-26 ENCOUNTER — Other Ambulatory Visit (INDEPENDENT_AMBULATORY_CARE_PROVIDER_SITE_OTHER): Payer: BC Managed Care – PPO

## 2020-09-26 ENCOUNTER — Other Ambulatory Visit: Payer: Self-pay

## 2020-09-26 DIAGNOSIS — E7800 Pure hypercholesterolemia, unspecified: Secondary | ICD-10-CM

## 2020-09-26 LAB — COMPREHENSIVE METABOLIC PANEL
ALT: 12 U/L (ref 0–35)
AST: 19 U/L (ref 0–37)
Albumin: 3.9 g/dL (ref 3.5–5.2)
Alkaline Phosphatase: 72 U/L (ref 39–117)
BUN: 8 mg/dL (ref 6–23)
CO2: 29 mEq/L (ref 19–32)
Calcium: 9.1 mg/dL (ref 8.4–10.5)
Chloride: 105 mEq/L (ref 96–112)
Creatinine, Ser: 0.79 mg/dL (ref 0.40–1.20)
GFR: 82.49 mL/min (ref >60.00)
Glucose, Bld: 86 mg/dL (ref 70–99)
Potassium: 3.7 mEq/L (ref 3.5–5.1)
Sodium: 141 mEq/L (ref 135–145)
Total Bilirubin: 0.7 mg/dL (ref 0.2–1.2)
Total Protein: 7 g/dL (ref 6.0–8.3)

## 2020-09-26 LAB — LIPID PANEL
Cholesterol: 197 mg/dL (ref 0–200)
HDL: 50.9 mg/dL (ref >39.00)
LDL Cholesterol: 122 mg/dL — ABNORMAL HIGH (ref 0–99)
NonHDL: 145.68
Total CHOL/HDL Ratio: 4
Triglycerides: 120 mg/dL (ref 0.0–149.0)
VLDL: 24 mg/dL (ref 0.0–40.0)

## 2020-09-26 NOTE — Progress Notes (Signed)
No critical labs need to be addressed urgently. We will discuss labs in detail at upcoming office visit.   

## 2020-10-03 ENCOUNTER — Other Ambulatory Visit: Payer: Self-pay

## 2020-10-03 ENCOUNTER — Ambulatory Visit (INDEPENDENT_AMBULATORY_CARE_PROVIDER_SITE_OTHER): Payer: BC Managed Care – PPO | Admitting: Family Medicine

## 2020-10-03 ENCOUNTER — Encounter: Payer: Self-pay | Admitting: Family Medicine

## 2020-10-03 VITALS — BP 110/78 | HR 68 | Temp 97.8°F | Ht 62.0 in | Wt 146.8 lb

## 2020-10-03 DIAGNOSIS — Z0000 Encounter for general adult medical examination without abnormal findings: Secondary | ICD-10-CM

## 2020-10-03 DIAGNOSIS — E7800 Pure hypercholesterolemia, unspecified: Secondary | ICD-10-CM

## 2020-10-03 NOTE — Patient Instructions (Signed)
Can do chest wall stretching for chest wall pain.  Follow up if worsening.  Keep working on healthy eating and regular exercise.

## 2020-10-03 NOTE — Progress Notes (Signed)
Patient ID: [name], female    DOB: 20-Feb-1961, 60 y.o.   MRN: [id number]  This visit was conducted in person.  BP 110/78   Pulse 68   Temp 97.8 F (36.6 C) (Temporal)   Ht 5\' 2"  (1.575 m)   Wt 146 lb 12 oz (66.6 kg)   SpO2 98%   BMI 26.84 kg/m    CC: Chief Complaint  Patient presents with  . Annual Exam    Subjective:   HPI: [name] is a 60 y.o. female presenting on 10/03/2020 for Annual Exam  Elevated Cholesterol:  Good control on simvastatin  40 mg daily  Lab Results  Component Value Date   CHOL 197 09/26/2020   HDL 50.90 09/26/2020   LDLCALC 122 (H) 09/26/2020   LDLDIRECT 168.3 07/15/2008   TRIG 120.0 09/26/2020   CHOLHDL 4 09/26/2020  Using medications without problems: Muscle aches:  Diet compliance: good heart healthy diet Exercise: walk on treadmill daily Other complaints: The 10-year ASCVD risk score Mikey Bussing DC Brooke Bonito., et al., 2013) is: 2.1%   Values used to calculate the score:     Age: 68 years     Sex: Female     Is Non-Hispanic African American: No     Diabetic: No     Tobacco smoker: No     Systolic Blood Pressure: 789 mmHg     Is BP treated: No     HDL Cholesterol: 50.9 mg/dL     Total Cholesterol: 197 mg/dL  [employer] Visit from 10/03/2020 in [place] at [facility] Total Score 0           Relevant past medical, surgical, family and social history reviewed and updated as indicated. Interim medical history since our last visit reviewed. Allergies and medications reviewed and updated. Outpatient Medications Prior to Visit  Medication Sig Dispense Refill  . Calcium-Phosphorus-Vitamin D (CALCIUM/D3 ADULT GUMMIES PO) Take 2-3 each by mouth daily. Vitafusion    . OVER THE COUNTER MEDICATION Vita Fusion 500 mg daily    . simvastatin (ZOCOR) 40 MG tablet TAKE 1 TABLET BY MOUTH EVERY OTHER NIGHT 45 tablet 2   No facility-administered medications prior to visit.     Per HPI unless specifically  indicated in ROS section below Review of Systems  Constitutional: Negative for fatigue and fever.  HENT: Negative for congestion.   Eyes: Negative for pain.  Respiratory: Negative for cough and shortness of breath.   Cardiovascular: Negative for chest pain, palpitations and leg swelling.  Gastrointestinal: Negative for abdominal pain.  Genitourinary: Negative for dysuria and vaginal bleeding.  Musculoskeletal: Negative for back pain.  Neurological: Negative for syncope, light-headedness and headaches.  Psychiatric/Behavioral: Negative for dysphoric mood.   Objective:  BP 110/78   Pulse 68   Temp 97.8 F (36.6 C) (Temporal)   Ht 5\' 2"  (1.575 m)   Wt 146 lb 12 oz (66.6 kg)   SpO2 98%   BMI 26.84 kg/m   Wt Readings from Last 3 Encounters:  10/03/20 146 lb 12 oz (66.6 kg)  06/16/20 146 lb (66.2 kg)  06/04/20 146 lb (66.2 kg)      Physical Exam Constitutional:      General: She is not in acute distress.    Appearance: Normal appearance. She is well-developed. She is not ill-appearing or toxic-appearing.  HENT:     Head: Normocephalic.     Right Ear: Hearing, tympanic membrane, ear canal and external ear normal.  Left Ear: Hearing, tympanic membrane, ear canal and external ear normal.     Nose: Nose normal.  Eyes:     General: Lids are normal. Lids are everted, no foreign bodies appreciated.     Conjunctiva/sclera: Conjunctivae normal.     Pupils: Pupils are equal, round, and reactive to light.  Neck:     Thyroid: No thyroid mass or thyromegaly.     Vascular: No carotid bruit.     Trachea: Trachea normal.  Cardiovascular:     Rate and Rhythm: Normal rate and regular rhythm.     Heart sounds: Normal heart sounds, S1 normal and S2 normal. No murmur heard. No gallop.   Pulmonary:     Effort: Pulmonary effort is normal. No respiratory distress.     Breath sounds: Normal breath sounds. No wheezing, rhonchi or rales.  Abdominal:     General: Bowel sounds are normal. There  is no distension or abdominal bruit.     Palpations: Abdomen is soft. There is no fluid wave or mass.     Tenderness: There is no abdominal tenderness. There is no guarding or rebound.     Hernia: No hernia is present.  Musculoskeletal:     Cervical back: Normal range of motion and neck supple.  Lymphadenopathy:     Cervical: No cervical adenopathy.  Skin:    General: Skin is warm and dry.     Findings: No rash.  Neurological:     Mental Status: She is alert.     Cranial Nerves: No cranial nerve deficit.     Sensory: No sensory deficit.  Psychiatric:        Mood and Affect: Mood is not anxious or depressed.        Speech: Speech normal.        Behavior: Behavior normal. Behavior is cooperative.        Judgment: Judgment normal.       Results for orders placed or performed in visit on 09/26/20  Comprehensive metabolic panel  Result Value Ref Range   Sodium 141 135 - 145 mEq/L   Potassium 3.7 3.5 - 5.1 mEq/L   Chloride 105 96 - 112 mEq/L   CO2 29 19 - 32 mEq/L   Glucose, Bld 86 70 - 99 mg/dL   BUN 8 6 - 23 mg/dL   Creatinine, Ser 0.79 0.40 - 1.20 mg/dL   Total Bilirubin 0.7 0.2 - 1.2 mg/dL   Alkaline Phosphatase 72 39 - 117 U/L   AST 19 0 - 37 U/L   ALT 12 0 - 35 U/L   Total Protein 7.0 6.0 - 8.3 g/dL   Albumin 3.9 3.5 - 5.2 g/dL   GFR 82.49 >60.00 mL/min   Calcium 9.1 8.4 - 10.5 mg/dL  Lipid panel  Result Value Ref Range   Cholesterol 197 0 - 200 mg/dL   Triglycerides 120.0 0.0 - 149.0 mg/dL   HDL 50.90 >39.00 mg/dL   VLDL 24.0 0.0 - 40.0 mg/dL   LDL Cholesterol 122 (H) 0 - 99 mg/dL   Total CHOL/HDL Ratio 4    NonHDL 145.68     This visit occurred during the SARS-CoV-2 public health emergency.  Safety protocols were in place, including screening questions prior to the visit, additional usage of staff PPE, and extensive cleaning of exam room while observing appropriate contact time as indicated for disinfecting solutions.   COVID 19 screen:  No recent travel or  known exposure to [place] The patient denies respiratory symptoms  of COVID 19 at this time. The importance of social distancing was discussed today.   Assessment and Plan The patient's preventative maintenance and recommended screening tests for an annual wellness exam were reviewed in full today. Brought up to date unless services declined.  Counselled on the importance of diet, exercise, and its role in overall health and mortality. The patient's FH and SH was reviewed, including their home life, tobacco status, and drug and alcohol status.   Continue DVE yearly given uterus still present. S/P BSO, Ovary evalnot indicated. Has cervix PAPs every5years, last nml 2020, neg. HPV Mammo:2008 breast cancer,per ONCnml 12/2019 DEXA: 10/2012: osteopenia (but was high risk since on Arimidex, now off arimidex for 3 years, shewas onfosamax ( was on for almost 2 years) to prevent deterioration, but has stopped now) Stable DEXA in 11/2014 osteopenia,normal in 2018 and 2020.. repeat in 5 years. Vaccines: Due for COVID and tdap, consider shingrix. Refused HIV screen. Hep C screen: done Colonoscopy 05/2020 polyp, Dr. Fuller Plan repeat in 7 years.  Problem List Items Addressed This Visit    HYPERCHOLESTEROLEMIA    Stable, chronic.  Continue current medication.   Simvastatin 40 mg daily       Other Visit Diagnoses    Routine general medical examination at a health care facility    -  Primary         Eliezer Lofts, MD

## 2020-10-03 NOTE — Assessment & Plan Note (Signed)
Stable, chronic.  Continue current medication.   Simvastatin 40 mg daily 

## 2020-11-12 ENCOUNTER — Encounter: Payer: BC Managed Care – PPO | Admitting: Dermatology

## 2020-12-30 ENCOUNTER — Other Ambulatory Visit: Payer: Self-pay

## 2020-12-30 ENCOUNTER — Ambulatory Visit (INDEPENDENT_AMBULATORY_CARE_PROVIDER_SITE_OTHER): Payer: BC Managed Care – PPO | Admitting: Dermatology

## 2020-12-30 ENCOUNTER — Encounter: Payer: Self-pay | Admitting: Dermatology

## 2020-12-30 ENCOUNTER — Other Ambulatory Visit: Payer: Self-pay | Admitting: Dermatology

## 2020-12-30 DIAGNOSIS — L578 Other skin changes due to chronic exposure to nonionizing radiation: Secondary | ICD-10-CM | POA: Diagnosis not present

## 2020-12-30 DIAGNOSIS — Z1283 Encounter for screening for malignant neoplasm of skin: Secondary | ICD-10-CM | POA: Diagnosis not present

## 2020-12-30 DIAGNOSIS — D492 Neoplasm of unspecified behavior of bone, soft tissue, and skin: Secondary | ICD-10-CM

## 2020-12-30 DIAGNOSIS — L814 Other melanin hyperpigmentation: Secondary | ICD-10-CM | POA: Diagnosis not present

## 2020-12-30 DIAGNOSIS — D229 Melanocytic nevi, unspecified: Secondary | ICD-10-CM

## 2020-12-30 DIAGNOSIS — D1800 Hemangioma unspecified site: Secondary | ICD-10-CM

## 2020-12-30 DIAGNOSIS — Z86018 Personal history of other benign neoplasm: Secondary | ICD-10-CM | POA: Diagnosis not present

## 2020-12-30 DIAGNOSIS — D485 Neoplasm of uncertain behavior of skin: Secondary | ICD-10-CM | POA: Diagnosis not present

## 2020-12-30 DIAGNOSIS — L821 Other seborrheic keratosis: Secondary | ICD-10-CM

## 2020-12-30 NOTE — Patient Instructions (Addendum)
IWound Care Instructions  Cleanse wound gently with soap and water once a day then pat dry with clean gauze. Apply a thing coat of Petrolatum (petroleum jelly, "Vaseline") over the wound (unless you have an allergy to this). We recommend that you use a new, sterile tube of Vaseline. Do not pick or remove scabs. Do not remove the yellow or white "healing tissue" from the base of the wound.  Cover the wound with fresh, clean, nonstick gauze and secure with paper tape. You may use Band-Aids in place of gauze and tape if the would is small enough, but would recommend trimming much of the tape off as there is often too much. Sometimes Band-Aids can irritate the skin.  You should call the office for your biopsy report after 1 week if you have not already been contacted.  If you experience any problems, such as abnormal amounts of bleeding, swelling, significant bruising, significant pain, or evidence of infection, please call the office immediately.  FOR ADULT SURGERY PATIENTS: If you need something for pain relief you may take 1 extra strength Tylenol (acetaminophen) AND 2 Ibuprofen (200mg  each) together every 4 hours as needed for pain. (do not take these if you are allergic to them or if you have a reason you should not take them.) Typically, you may only need pain medication for 1 to 3 days.   If you have any questions or concerns for your doctor, please call our main line at [phone] and press option 4 to reach your doctor's medical assistant. If no one answers, please leave a voicemail as directed and we will return your call as soon as possible. Messages left after 4 pm will be answered the following business day.   You may also send [place] a message via [place]. We typically respond to MyChart messages within 1-2 business days.  For prescription refills, please ask your pharmacy to contact our office. Our fax number is [phone].  If you have an urgent issue when the clinic is closed that  cannot wait until the next business day, you can page your doctor at the number below.    Please note that while we do our best to be available for urgent issues outside of office hours, we are not available 24/7.   If you have an urgent issue and are unable to reach [place], you may choose to seek medical care at your doctor's office, retail clinic, urgent care center, or emergency room.  If you have a medical emergency, please immediately call 911 or go to the emergency department.  Pager Numbers  - Dr. Nehemiah Massed: [phone]  - Dr. Laurence Ferrari: [phone]  - Dr. Nicole Kindred: [phone]  In the event of inclement weather, please call our main line at [phone] for an update on the status of any delays or closures.  Dermatology Medication Tips: Please keep the boxes that topical medications come in in order to help keep track of the instructions about where and how to use these. Pharmacies typically print the medication instructions only on the boxes and not directly on the medication tubes.   If your medication is too expensive, please contact our office at [phone] option 4 or send [place] a message through [place].   We are unable to tell what your co-pay for medications will be in advance as this is different depending on your insurance coverage. However, we may be able to find a substitute medication at lower cost or fill out paperwork to get insurance to cover a needed  medication.   If a prior authorization is required to get your medication covered by your insurance company, please allow [place] 1-2 business days to complete this process.  Drug prices often vary depending on where the prescription is filled and some pharmacies may offer cheaper prices.  The website www.goodrx.com contains coupons for medications through different pharmacies. The prices here do not account for what the cost may be with help from insurance (it may be cheaper with your insurance), but the website can give you the  price if you did not use any insurance.  - You can print the associated coupon and take it with your prescription to the pharmacy.  - You may also stop by our office during regular business hours and pick up a GoodRx coupon card.  - If you need your prescription sent electronically to a different pharmacy, notify our office through [facility] or by phone at [phone] option 4.

## 2020-12-30 NOTE — Progress Notes (Signed)
Follow-Up Visit   Subjective  [name] is a 60 y.o. female who presents for the following: Annual Exam (Mole check ). Hx of Dysplastic nevus on the mid chest  The patient presents for Total-Body Skin Exam (TBSE) for skin cancer screening and mole check.   The following portions of the chart were reviewed this encounter and updated as appropriate:   Tobacco  Allergies  Meds  Problems  Med Hx  Surg Hx  Fam Hx       Review of Systems:  No other skin or systemic complaints except as noted in HPI or Assessment and Plan.  Objective  Well appearing patient in no apparent distress; mood and affect are within normal limits.  A full examination was performed including scalp, head, eyes, ears, nose, lips, neck, chest, axillae, abdomen, back, buttocks, bilateral upper extremities, bilateral lower extremities, hands, feet, fingers, toes, fingernails, and toenails. All findings within normal limits unless otherwise noted below.  mid chest Scar with no evidence of recurrence.   mid chest left of midline 0.5 cm pink papule         Assessment & Plan   Neoplasm of skin mid chest left of midline  Skin / nail biopsy Type of biopsy: tangential   Informed consent: discussed and consent obtained   Timeout: patient name, date of birth, surgical site, and procedure verified   Procedure prep:  Patient was prepped and draped in usual sterile fashion Prep type:  Isopropyl alcohol Anesthesia: the lesion was anesthetized in a standard fashion   Anesthetic:  1% lidocaine w/ epinephrine 1-100,000 buffered w/ 8.4% NaHCO3 Instrument used: flexible razor blade   Hemostasis achieved with: aluminum chloride   Outcome: patient tolerated procedure well   Post-procedure details: wound care instructions given   Additional details:  Petrolatum and a pressure bandage applied  Specimen 1 - Surgical pathology Differential Diagnosis: R/O Lichenoid keratosis vs Atypia   Check Margins: No  R/O  lichenoid keratosis vs Atypia   Lentigines - Scattered tan macules - Due to sun exposure - Benign-appering, observe - Recommend daily broad spectrum sunscreen SPF 30+ to sun-exposed areas, reapply every 2 hours as needed. - Call for any changes  Seborrheic Keratoses - Stuck-on, waxy, tan-brown papules and/or plaques  - Benign-appearing - Discussed benign etiology and prognosis. - Observe - Call for any changes  Melanocytic Nevi - Tan-brown and/or pink-flesh-colored symmetric macules and papules - Benign appearing on exam today - Observation - Call clinic for new or changing moles - Recommend daily use of broad spectrum spf 30+ sunscreen to sun-exposed areas.   Hemangiomas - Red papules - Discussed benign nature - Observe - Call for any changes  History of Dysplastic Nevus mid chest - History of atypical melanocytic proliferation, could not rule out early evolving lentigo maligna s/p excision 2020 - No evidence of recurrence today - Recommend regular full body skin exams - Recommend daily broad spectrum sunscreen SPF 30+ to sun-exposed areas, reapply every 2 hours as needed.  - Call if any new or changing lesions are noted between office visits  Actinic Damage - Chronic condition, secondary to cumulative UV/sun exposure - diffuse scaly erythematous macules with underlying dyspigmentation - Recommend daily broad spectrum sunscreen SPF 30+ to sun-exposed areas, reapply every 2 hours as needed.  - Staying in the shade or wearing long sleeves, sun glasses (UVA+UVB protection) and wide brim hats (4-inch brim around the entire circumference of the hat) are also recommended for sun protection.  - Call  for new or changing lesions.  Skin cancer screening performed today.   Return in about 1 year (around 12/30/2021) for TBSE, hx of dysplastic nevus .  I, Marye Round, CMA, am acting as scribe for Forest Gleason, MD .   Documentation: I have reviewed the above documentation for  accuracy and completeness, and I agree with the above.  Forest Gleason, MD

## 2021-01-05 ENCOUNTER — Ambulatory Visit
Admission: RE | Admit: 2021-01-05 | Discharge: 2021-01-05 | Disposition: A | Discharge status: Home / Self Care | Payer: BC Managed Care – PPO | Source: Ambulatory Visit | Attending: Family Medicine | Admitting: Family Medicine

## 2021-01-05 ENCOUNTER — Other Ambulatory Visit: Payer: Self-pay

## 2021-01-05 DIAGNOSIS — Z1231 Encounter for screening mammogram for malignant neoplasm of breast: Secondary | ICD-10-CM

## 2021-03-14 ENCOUNTER — Other Ambulatory Visit: Payer: Self-pay | Admitting: Family Medicine

## 2021-03-16 ENCOUNTER — Ambulatory Visit: Payer: BC Managed Care – PPO | Admitting: Hematology

## 2021-03-16 ENCOUNTER — Other Ambulatory Visit: Payer: BC Managed Care – PPO

## 2021-03-20 ENCOUNTER — Other Ambulatory Visit: Payer: Self-pay

## 2021-03-20 ENCOUNTER — Inpatient Hospital Stay: Payer: BC Managed Care – PPO | Attending: Hematology

## 2021-03-20 ENCOUNTER — Inpatient Hospital Stay (HOSPITAL_BASED_OUTPATIENT_CLINIC_OR_DEPARTMENT_OTHER): Payer: BC Managed Care – PPO | Admitting: Hematology

## 2021-03-20 ENCOUNTER — Encounter: Payer: Self-pay | Admitting: Hematology

## 2021-03-20 VITALS — BP 139/65 | HR 60 | Temp 98.4°F | Resp 17 | Wt 147.0 lb

## 2021-03-20 DIAGNOSIS — Z853 Personal history of malignant neoplasm of breast: Secondary | ICD-10-CM | POA: Insufficient documentation

## 2021-03-20 DIAGNOSIS — M8580 Other specified disorders of bone density and structure, unspecified site: Secondary | ICD-10-CM | POA: Insufficient documentation

## 2021-03-20 DIAGNOSIS — Z170 Estrogen receptor positive status [ER+]: Secondary | ICD-10-CM | POA: Insufficient documentation

## 2021-03-20 DIAGNOSIS — Z923 Personal history of irradiation: Secondary | ICD-10-CM | POA: Insufficient documentation

## 2021-03-20 DIAGNOSIS — Z85820 Personal history of malignant melanoma of skin: Secondary | ICD-10-CM | POA: Diagnosis not present

## 2021-03-20 LAB — COMPREHENSIVE METABOLIC PANEL
ALT: 13 U/L (ref 0–44)
AST: 19 U/L (ref 15–41)
Albumin: 3.9 g/dL (ref 3.5–5.0)
Alkaline Phosphatase: 79 U/L (ref 38–126)
Anion gap: 10 (ref 5–15)
BUN: 10 mg/dL (ref 6–20)
CO2: 26 mmol/L (ref 22–32)
Calcium: 9.5 mg/dL (ref 8.9–10.3)
Chloride: 108 mmol/L (ref 98–111)
Creatinine, Ser: 0.84 mg/dL (ref 0.44–1.00)
GFR, Estimated: >60 mL/min (ref >60)
Glucose, Bld: 71 mg/dL (ref 70–99)
Potassium: 3.6 mmol/L (ref 3.5–5.1)
Sodium: 144 mmol/L (ref 135–145)
Total Bilirubin: 0.8 mg/dL (ref 0.3–1.2)
Total Protein: 7.2 g/dL (ref 6.5–8.1)

## 2021-03-20 LAB — CBC WITH DIFFERENTIAL/PLATELET
Abs Immature Granulocytes: 0.01 10*3/uL (ref 0.00–0.07)
Basophils Absolute: 0 10*3/uL (ref 0.0–0.1)
Basophils Relative: 1 %
Eosinophils Absolute: 0.1 10*3/uL (ref 0.0–0.5)
Eosinophils Relative: 1 %
HCT: 42.6 % (ref 36.0–46.0)
Hemoglobin: 14 g/dL (ref 12.0–15.0)
Immature Granulocytes: 0 %
Lymphocytes Relative: 24 %
Lymphs Abs: 1.6 10*3/uL (ref 0.7–4.0)
MCH: 28.7 pg (ref 26.0–34.0)
MCHC: 32.9 g/dL (ref 30.0–36.0)
MCV: 87.5 fL (ref 80.0–100.0)
Monocytes Absolute: 0.4 10*3/uL (ref 0.1–1.0)
Monocytes Relative: 6 %
Neutro Abs: 4.7 10*3/uL (ref 1.7–7.7)
Neutrophils Relative %: 68 %
Platelets: 176 10*3/uL (ref 150–400)
RBC: 4.87 MIL/uL (ref 3.87–5.11)
RDW: 13.5 % (ref 11.5–15.5)
WBC: 6.8 10*3/uL (ref 4.0–10.5)
nRBC: 0 % (ref 0.0–0.2)

## 2021-03-20 NOTE — Progress Notes (Signed)
[facility]   [phone] [phone]   Clinic Follow up Note   Patient Care Team: Jinny Sanders, MD as PCP - General  Date of Service:  03/20/2021  CHIEF COMPLAINT: f/u of left breast cancer  CURRENT THERAPY:  Surveillance  ASSESSMENT & PLAN:  [name] is a 60 y.o. female with   1. 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 She is s/p left lumpectomy and b/l reconstruction with implants, adjuvant radiation and 6 years of adjuvant Anastrozole.  -She has been on breast cancer surveillance since then -left breast ultrasound on 03/29/2019 showed an irregular slightly hypoechoic mass at the 11 o'clock position associated with the midportion of the lumpectomy scar measuring 6 x 10 x 5 mm.  Path on 04/03/2019 showed benign fibroadipose tissue but discordant.  She underwent lumpectomy on 06/07/2019 by Dr. Lucia Gaskins which showed benign breast tissue with nodular hyalin fibrosis and dystrophic calcifications, no evidence of malignancy. -her most recent screening mammogram 01/05/21 was benign -She is clinically doing well, no any concerns for recurrence. -Due to her previous DCIS, she has high risk for new breast cancer, I discussed screening breast MRI.  She will think about it. -It has been 15 years since her initial diagnosis, I will release her from my clinic.   2.  Osteopenia -Her bone density got worse when she was on  Arimidex. She was treated with Fosamax which was held in February 2016 due to her some dental issue, she was on Fosamax for 1.5 years and osteopenia resolved in 2018. -12/2018 DEXA shows osteopenia with lowest T-score -1 at left femur neck. Mostly stable.  -Continue calcium and vitamin D, and weightbearing exercises   3. Skin cancer  -Per patient her dermatologist removed skin cancer from her left breast which was melanoma.  -She  will continue to f/u with her Dermatologist.  -Strongly encouraged her to use sun protection  4. Genetics  -She was 60 yo when she was diagnosed with breast cancer and never had genetic testing per patient -I will refer her to genetics.  PLAN: -Genetic referral -I will see her as needed in the future.  She will follow-up with her primary care physician for breast cancer surveillance.   No problem-specific Assessment & Plan notes found for this encounter.   SUMMARY OF ONCOLOGIC HISTORY: Oncology History Overview Note  Breast cancer of upper-outer quadrant of left female breast   Staging form: Breast, AJCC 7th Edition     Pathologic: Stage IA (T1b, N0, cM0) - Unsigned      History of breast cancer, left  05/2006 Initial Diagnosis   Breast cancer of upper-outer quadrant of left female breast   05/2006 Initial Biopsy    left breast mass biopsy showed invasive ductal carcinoma, the biopsy was done in [facility]   05/2006 Receptors her2   ER positive, PR positive, HER-2 negative   05/2006 Imaging   Mammogram and ultrasound showed a 8 mm mass in the left breast 10:00 position   10/11/2006 Pathology Results    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 0 cm, 2 sentinel lymph nodes negative, (+)  DCIS   10/11/2006 Surgery   Left lumpectomy with negative margins.   11/22/2006 - 12/24/2006 Radiation Therapy    left breast irradiation, under Dr. [name]    02/2007 Surgery    bilateral salpingo-oophorectomy  03/2007 - 12/2012 Anti-estrogen oral therapy   adjuvant Arimidex 46m daily    11/28/2017 Mammogram   11/28/2017 Mammogram IMPRESSION: No mammographic evidence of malignancy. A result letter of this screening mammogram will be mailed directly to the patient.   01/02/2019 Mammogram   IMPRESSION: No mammographic evidence of malignancy.   03/29/2019 Breast [place]  Targeted ultrasound is performed, showing an irregular slightly hypoechoic mass with an area  of linear extension into the skin at 11 o'clock position 6 cm from the nipple. Findings are at the site of the palpable lump associated with the midportion of the lumpectomy scar. Although indistinct, the most focal portion of this mass measures approximately 6 x 10 x 5 mm. Ultrasound of the left axilla is negative for lymphadenopathy.   04/03/2019 Pathology Results   Breast, left, needle core biopsy, 11 o'clock - BENIGN FIBROADIPOSE TISSUE. SEE NOTE - NEGATIVE FOR CARCINOMA   06/07/2019 Pathology Results   FINAL MICROSCOPIC DIAGNOSIS:  A. BREAST, LEFT W/SEED, LUMPECTOMY:  - Benign breast parenchyma with nodular hyaline fibrosis and dystrophic  calcifications  - No evidence of malignancy    01/04/2020 Mammogram   IMPRESSION: No mammographic evidence of malignancy.      INTERVAL HISTORY:  Tara Pontarelliis here for a follow up of breast cancer. She was last seen by NP Lacie a year ago. She presents to the clinic alone.  She is clinically doing well denies any new pain, or other concerns.  She has normal appetite and weight has been stable.   All other systems were reviewed with the patient and are negative.  MEDICAL HISTORY:  Past Medical History:  Diagnosis Date   Arthritis    left  knee    Atypical mole 07/27/2018   AMP at mid chest. Cannot exclude early evolving LM.  Excised 08/30/2018. Margins free.    [place]    Hyperlipidemia    Osteopenia    Osteopenia    Personal history of radiation therapy 2008    SURGICAL HISTORY: Past Surgical History:  Procedure Laterality Date   AUGMENTATION MAMMAPLASTY Bilateral 2001   BREAST LUMPECTOMY Left 2008   BREAST LUMPECTOMY WITH RADIOACTIVE SEED LOCALIZATION Left 06/07/2019   Procedure: LEFT BREAST LUMPECTOMY WITH RADIOACTIVE SEED LOCALIZATION;  Surgeon: NAlphonsa Overall MD;  [facility]  Service: General;  Laterality: Left;   BREAST SURGERY  LEFT   LUMPECTOMY   COLONOSCOPY      OOPHRECTOMY     BI LATERERAL FOR CANCER PREVENTON   PILONIDAL CYST EXCISION      I have reviewed the social history and family history with the patient and they are unchanged from previous note.  ALLERGIES:  has No Known Allergies.  MEDICATIONS:  Current Outpatient Medications  Medication Sig Dispense Refill   Calcium-Phosphorus-Vitamin D (CALCIUM/D3 ADULT GUMMIES PO) Take 2-3 each by mouth daily. Vitafusion     OVER THE COUNTER MEDICATION Vita Fusion 500 mg daily     simvastatin (ZOCOR) 40 MG tablet TAKE 1 TABLET BY MOUTH EVERY OTHER NIGHT 45 tablet 1   No current facility-administered medications for this visit.    PHYSICAL EXAMINATION: ECOG PERFORMANCE STATUS: 0 - Asymptomatic  Vitals:   03/20/21 1553  BP: 139/65  Pulse: 60  Resp: 17  Temp: 98.4 F (36.9 C)  SpO2: 98%   Wt Readings from Last 3 Encounters:  03/20/21 147 lb (66.7 kg)  10/03/20 146 lb 12 oz (66.6 kg)  06/16/20 146 lb (  66.2 kg)     GENERAL:alert, no distress and comfortable SKIN: skin color, texture, turgor are normal, no rashes or significant lesions EYES: normal, Conjunctiva are pink and non-injected, sclera clear Musculoskeletal:no cyanosis of digits and no clubbing  NEURO: alert & oriented x 3 with fluent speech, no focal motor/sensory deficits Pt declined breast exam today   LABORATORY DATA:  I have reviewed the data as listed CBC Latest Ref Rng & Units 03/20/2021 03/17/2020 03/16/2019  WBC 4.0 - 10.5 K/uL 6.8 6.1 6.2  Hemoglobin 12.0 - 15.0 g/dL 14.0 13.1 13.2  Hematocrit 36.0 - 46.0 % 42.6 40.2 39.8  Platelets 150 - 400 K/uL 176 248 234     CMP Latest Ref Rng & Units 03/20/2021 09/26/2020 03/17/2020  Glucose 70 - 99 mg/dL 71 86 99  BUN 6 - 20 mg/dL _0 Creatinine 0.44 - 1.00 mg/dL 0.84 0.79 0.80  Sodium 135 - 145 mmol/L 144 141 140  Potassium 3.5 - 5.1 mmol/L 3.6 3.7 3.9  Chloride 98 - 111 mmol/L 108 105 104  CO2 22 - 32 mmol/L _1 Calcium 8.9 - 10.3 mg/dL 9.5 9.1 9.9  Total  Protein 6.5 - 8.1 g/dL 7.2 7.0 7.1  Total Bilirubin 0.3 - 1.2 mg/dL 0.8 0.7 0.6  Alkaline Phos 38 - 126 U/L 79 72 82  AST 15 - 41 U/L _2 ALT 0 - 44 U/L _3 RADIOGRAPHIC STUDIES: I have personally reviewed the radiological images as listed and agreed with the findings in the report. No results found.    Orders Placed This Encounter  Procedures   Ambulatory referral to Genetics    Referral Priority:   Routine    Referral Type:   Consultation    Referral Reason:   Specialty Services Required    Number of Visits Requested:   1   All questions were answered. The patient knows to call the clinic with any problems, questions or concerns. No barriers to learning was detected. The total time spent in the appointment was 25 minutes.     Truitt Merle, MD 03/20/2021   I, Wilburn Mylar, am acting as scribe for Truitt Merle, MD.   I have reviewed the above documentation for accuracy and completeness, and I agree with the above.

## 2021-03-24 ENCOUNTER — Telehealth: Payer: Self-pay | Admitting: Hematology

## 2021-03-24 NOTE — Telephone Encounter (Signed)
Left message with follow-up appointment per 9/2 los. 

## 2021-03-31 ENCOUNTER — Inpatient Hospital Stay: Payer: BC Managed Care – PPO

## 2021-03-31 ENCOUNTER — Inpatient Hospital Stay: Payer: BC Managed Care – PPO | Admitting: Genetic Counselor

## 2021-04-09 ENCOUNTER — Inpatient Hospital Stay (HOSPITAL_BASED_OUTPATIENT_CLINIC_OR_DEPARTMENT_OTHER): Payer: BC Managed Care – PPO | Admitting: Genetic Counselor

## 2021-04-09 ENCOUNTER — Inpatient Hospital Stay: Payer: BC Managed Care – PPO

## 2021-04-09 ENCOUNTER — Other Ambulatory Visit: Payer: Self-pay | Admitting: Genetic Counselor

## 2021-04-09 ENCOUNTER — Other Ambulatory Visit: Payer: Self-pay

## 2021-04-09 DIAGNOSIS — Z853 Personal history of malignant neoplasm of breast: Secondary | ICD-10-CM

## 2021-04-09 DIAGNOSIS — Z85820 Personal history of malignant melanoma of skin: Secondary | ICD-10-CM | POA: Diagnosis not present

## 2021-04-09 DIAGNOSIS — Z800 Family history of malignant neoplasm of digestive organs: Secondary | ICD-10-CM | POA: Diagnosis not present

## 2021-04-09 DIAGNOSIS — Z1379 Encounter for other screening for genetic and chromosomal anomalies: Secondary | ICD-10-CM

## 2021-04-09 DIAGNOSIS — M8580 Other specified disorders of bone density and structure, unspecified site: Secondary | ICD-10-CM

## 2021-04-09 LAB — VITAMIN D 25 HYDROXY (VIT D DEFICIENCY, FRACTURES): Vit D, 25-Hydroxy: 46.5 ng/mL (ref 30–100)

## 2021-04-09 LAB — GENETIC SCREENING ORDER

## 2021-04-10 ENCOUNTER — Encounter: Payer: Self-pay | Admitting: Genetic Counselor

## 2021-04-10 DIAGNOSIS — Z800 Family history of malignant neoplasm of digestive organs: Secondary | ICD-10-CM | POA: Insufficient documentation

## 2021-04-10 NOTE — Progress Notes (Signed)
REFERRING PROVIDER: Truitt Merle, MD [place], [place] [address]  PRIMARY PROVIDER:  Jinny Sanders, MD  PRIMARY REASON FOR VISIT:  1. History of breast cancer, left   2. History of melanoma   3. Family history of pancreatic cancer   4. Family history of colon cancer     HISTORY OF PRESENT ILLNESS:   Ms. Mesquita, a 60 y.o. female, was seen for a [facility] cancer genetics consultation at the request of Dr. Diona Browner due to a personal and family history of cancer.  Ms. Adan presents to clinic today to discuss the possibility of a hereditary predisposition to cancer, to discuss genetic testing, and to further clarify her future cancer risks, as well as potential cancer risks for family members.   Ms. Mirkin was diagnosed with breast cancer at 60 y.o. and melanoma at 60 y.o..    CANCER HISTORY:  Oncology History Overview Note  Breast cancer of upper-outer quadrant of left female breast   Staging form: Breast, AJCC 7th Edition     Pathologic: Stage IA (T1b, N0, cM0) - Unsigned      History of breast cancer, left  05/2006 Initial Diagnosis   Breast cancer of upper-outer quadrant of left female breast   05/2006 Initial Biopsy    left breast mass biopsy showed invasive ductal carcinoma, the biopsy was done in [facility]   05/2006 Receptors her2   ER positive, PR positive, HER-2 negative   05/2006 Imaging   Mammogram and ultrasound showed a 8 mm mass in the left breast 10:00 position   10/11/2006 Pathology Results    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 0 cm, 2 sentinel lymph nodes negative, (+)  DCIS   10/11/2006 Surgery   Left lumpectomy with negative margins.   11/22/2006 - 12/24/2006 Radiation Therapy    left breast irradiation, under Dr. [name]    02/2007 Surgery    bilateral salpingo-oophorectomy    03/2007 - 12/2012 Anti-estrogen oral therapy   adjuvant Arimidex 68m daily    11/28/2017 Mammogram   11/28/2017  Mammogram IMPRESSION: No mammographic evidence of malignancy. A result letter of this screening mammogram will be mailed directly to the patient.   01/02/2019 Mammogram   IMPRESSION: No mammographic evidence of malignancy.   03/29/2019 Breast [place]  Targeted ultrasound is performed, showing an irregular slightly hypoechoic mass with an area of linear extension into the skin at 11 o'clock position 6 cm from the nipple. Findings are at the site of the palpable lump associated with the midportion of the lumpectomy scar. Although indistinct, the most focal portion of this mass measures approximately 6 x 10 x 5 mm. Ultrasound of the left axilla is negative for lymphadenopathy.   04/03/2019 Pathology Results   Breast, left, needle core biopsy, 11 o'clock - BENIGN FIBROADIPOSE TISSUE. SEE NOTE - NEGATIVE FOR CARCINOMA   06/07/2019 Pathology Results   FINAL MICROSCOPIC DIAGNOSIS:  A. BREAST, LEFT W/SEED, LUMPECTOMY:  - Benign breast parenchyma with nodular hyaline fibrosis and dystrophic  calcifications  - No evidence of malignancy    01/04/2020 Mammogram   IMPRESSION: No mammographic evidence of malignancy.      RISK FACTORS:  First live birth at age 60  OCP use for approximately 11 years.  Ovaries intact: no.  Hysterectomy: no.  Menopausal status: postmenopausal.  HRT use: 0 years. Colonoscopy: yes, in 2021 and 1 polyp was identified. Mammogram within the last year: yes. Any excessive radiation exposure in the past: no  Past Medical History:  Diagnosis Date   Arthritis    left  knee    Atypical mole 07/27/2018   AMP at mid chest. Cannot exclude early evolving LM.  Excised 08/30/2018. Margins free.    Breast cancer ([place])    left    Hyperlipidemia    Osteopenia    Osteopenia    Personal history of radiation therapy 2008    Past Surgical History:  Procedure Laterality Date   AUGMENTATION MAMMAPLASTY Bilateral 2001   BREAST LUMPECTOMY Left 2008   BREAST LUMPECTOMY  WITH RADIOACTIVE SEED LOCALIZATION Left 06/07/2019   Procedure: LEFT BREAST LUMPECTOMY WITH RADIOACTIVE SEED LOCALIZATION;  Surgeon: Alphonsa Overall, MD;  Location: [facility];  Service: General;  Laterality: Left;   BREAST SURGERY  LEFT   LUMPECTOMY   COLONOSCOPY     OOPHRECTOMY     BI LATERERAL FOR CANCER PREVENTON   PILONIDAL CYST EXCISION      Social History   Socioeconomic History   Marital status: Married    Spouse name: Not on file   Number of children: 2   Years of education: Not on file   Highest education level: Not on file  Occupational History   Occupation: [employer]    Employer: UNEMPLOYED  Tobacco Use   Smoking status: Never   Smokeless tobacco: Never  Vaping Use   Vaping Use: Never used  Substance and Sexual Activity   Alcohol use: No   Drug use: No   Sexual activity: Not on file  Other Topics Concern   Not on file  Social History Narrative   REGULAR EXERCISE-- YES, WALKS DAILY      DIET: [place]   Social Determinants of Health   Financial Resource Strain: Not on file  Food Insecurity: Not on file  Transportation Needs: Not on file  Physical Activity: Not on file  Stress: Not on file  Social Connections: Not on file     FAMILY HISTORY:  We obtained a detailed, 4-generation family history.  Significant diagnoses are listed below:  Family History  Problem Relation Age of Onset   Squamous cell carcinoma Mother 47   Lung cancer Maternal Uncle        dx. 60s, agent orange   Colon cancer Maternal Grandmother 83   Bone cancer Maternal Grandfather    Breast cancer Other    Pancreatic cancer Other    Pancreatic cancer Cousin 75     Ms. Badgett's mother was recently diagnosed with squamous cell carcinoma of the mouth at 66, she had a TAH/BSO at 13. Her maternal cousin was diagnosed with pancreatic cancer at 65. Her maternal grandmother was diagnosed with colon cancer at 31 and died due to metastasis days later.  Multiple sisters of her maternal grandmother had cancer including breast, pancreatic, and stomach cancer. Her maternal grandfather was diagnosed with bone cancer in his 33s and died at 43 due to a MI. Ms. Kracke is unaware of previous family history of genetic testing for hereditary cancer risks. There no reported Ashkenazi Jewish ancestry.   GENETIC COUNSELING ASSESSMENT: Ms. Zilberman is a 60 y.o. female with a personal and family history of cancer which is somewhat suggestive of a hereditary predisposition to cancer given her young age at diagnosis. We, therefore, discussed and recommended the following at today's visit.   DISCUSSION: We discussed that 5 - 10% of cancer is hereditary, with most cases of breast cancer associated with BRCA1/2.  There are other genes that  can be associated with hereditary cancer syndromes.  We discussed that testing is beneficial for several reasons including knowing how to follow individuals after completing their treatment and understanding if other family members could be at risk for cancer and allowing them to undergo genetic testing.   We reviewed the characteristics, features and inheritance patterns of hereditary cancer syndromes. We also discussed genetic testing, including the appropriate family members to test, the process of testing, insurance coverage and turn-around-time for results. We discussed the implications of a negative, positive, carrier and/or variant of uncertain significant result.   Ms. Holtmeyer  was offered a common hereditary cancer panel (47 genes) and an expanded pan-cancer panel (77 genes). Ms. Carbonell was informed of the benefits and limitations of each panel, including that expanded pan-cancer panels contain genes that do not have clear management guidelines at this point in time.  We also discussed that as the number of genes included on a panel increases, the chances of variants of uncertain significance increases.  After considering the  benefits and limitations of each gene panel, Ms. Mcbroom  elected to have [facility] CancerNext-Expanded Panel +RNA (77 genes). The CancerNext-Expanded gene panel offered by [facility] and includes sequencing, rearrangement, and RNA analysis for the following 77 genes: AIP, ALK, APC, ATM, AXIN2, BAP1, BARD1, BLM, BMPR1A, BRCA1, BRCA2, BRIP1, CDC73, CDH1, CDK4, CDKN1B, CDKN2A, CHEK2, CTNNA1, DICER1, FANCC, FH, FLCN, GALNT12, KIF1B, LZTR1, MAX, MEN1, MET, MLH1, MSH2, MSH3, MSH6, MUTYH, NBN, NF1, NF2, NTHL1, PALB2, PHOX2B, PMS2, POT1, PRKAR1A, PTCH1, PTEN, RAD51C, RAD51D, RB1, RECQL, RET, SDHA, SDHAF2, SDHB, SDHC, SDHD, SMAD4, SMARCA4, SMARCB1, SMARCE1, STK11, SUFU, TMEM127, TP53, TSC1, TSC2, VHL and XRCC2 (sequencing and deletion/duplication); EGFR, EGLN1, HOXB13, KIT, MITF, PDGFRA, POLD1, and POLE (sequencing only); EPCAM and GREM1 (deletion/duplication only).    Based on Ms. Argote's personal and family history of cancer, she meets medical criteria for genetic testing. Despite that she meets criteria, she may still have an out of pocket cost. We discussed that if her out of pocket cost for testing is over $100, the laboratory will call and confirm whether she wants to proceed with testing.  If the out of pocket cost of testing is less than $100 she will be billed by the genetic testing laboratory.   PLAN: After considering the risks, benefits, and limitations, Ms. Ballester provided informed consent to pursue genetic testing and the blood sample was sent to [facility] for analysis of the CancerNext-Expanded Panel+RNA (77 genes). Results should be available within approximately 2-3 weeks' time, at which point they will be disclosed by telephone to Ms. Pearce, as will any additional recommendations warranted by these results. Ms. Deremer will receive a summary of her genetic counseling visit and a copy of her results once available. This information will also be available in Epic.   Ms. Bastedo  questions were answered to her satisfaction today. Our contact information was provided should additional questions or concerns arise. Thank you for the referral and allowing [place] to share in the care of your patient.   Lucille Passy, MS, [facility] Genetic Counselor [email or web address] (P) [phone]  The patient was seen for a total of 40 minutes in face-to-face genetic counseling. The patient brought her mother. Drs. Magrinat, Lindi Adie and/or Burr Medico were available to discuss this case as needed.  _______________________________________________________________________ For Office Staff:  Number of people involved in session: 2 Was an Intern/ student involved with case: no

## 2021-05-01 ENCOUNTER — Telehealth: Payer: Self-pay | Admitting: Genetic Counselor

## 2021-05-01 ENCOUNTER — Encounter: Payer: Self-pay | Admitting: Genetic Counselor

## 2021-05-01 DIAGNOSIS — Z1379 Encounter for other screening for genetic and chromosomal anomalies: Secondary | ICD-10-CM | POA: Insufficient documentation

## 2021-05-01 NOTE — Telephone Encounter (Signed)
I attempted to contact [name] to discuss her genetic testing results (77 genes). I left a voicemail requesting she call me back at [phone].  Tara Passy, MS, [facility] Genetic Counselor [email or web address] (P) [phone]

## 2021-05-01 NOTE — Telephone Encounter (Signed)
I contacted [name] to discuss her genetic testing results. One pathogenic variant was identified in the [place] gene. 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 No pathogenic variants were identified in the additional 76 genes analyzed.   The test report has been scanned into EPIC and is located under the Molecular Pathology section of the Results Review tab.  A portion of the result report is included below for reference. Detailed clinic note to follow.  Lucille Passy, MS, [facility] Genetic Counselor [email or web address] (P) [phone]

## 2021-05-04 ENCOUNTER — Ambulatory Visit: Payer: Self-pay | Admitting: Genetic Counselor

## 2021-05-04 DIAGNOSIS — Z853 Personal history of malignant neoplasm of breast: Secondary | ICD-10-CM

## 2021-05-04 NOTE — Progress Notes (Signed)
HPI:   Ms. Alfred was previously seen in the [facility] clinic due to a personal and family history of cancer and concerns regarding a hereditary predisposition to cancer. Please refer to our prior cancer genetics clinic note for more information regarding our discussion, assessment and recommendations, at the time. Ms. Louthan recent genetic test results were disclosed to her, as were recommendations warranted by these results. These results and recommendations are discussed in more detail below.  CANCER HISTORY:  Oncology History Overview Note  Breast cancer of upper-outer quadrant of left female breast   Staging form: Breast, AJCC 7th Edition     Pathologic: Stage IA (T1b, N0, cM0) - Unsigned      History of breast cancer, left  05/2006 Initial Diagnosis   Breast cancer of upper-outer quadrant of left female breast   05/2006 Initial Biopsy    left breast mass biopsy showed invasive ductal carcinoma, the biopsy was done in [facility]   05/2006 Receptors her2   ER positive, PR positive, HER-2 negative   05/2006 Imaging   Mammogram and ultrasound showed a 8 mm mass in the left breast 10:00 position   10/11/2006 Pathology Results    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 0 cm, 2 sentinel lymph nodes negative, (+)  DCIS   10/11/2006 Surgery   Left lumpectomy with negative margins.   11/22/2006 - 12/24/2006 Radiation Therapy    left breast irradiation, under Dr. [name]    02/2007 Surgery    bilateral salpingo-oophorectomy    03/2007 - 12/2012 Anti-estrogen oral therapy   adjuvant Arimidex $RemoveBeforeDEI'1mg'[id number]$  daily    11/28/2017 Mammogram   11/28/2017 Mammogram IMPRESSION: No mammographic evidence of malignancy. A result letter of this screening mammogram will be mailed directly to the patient.   01/02/2019 Mammogram   IMPRESSION: No mammographic evidence of malignancy.   03/29/2019 Breast US   Targeted ultrasound is performed, showing an irregular  slightly hypoechoic mass with an area of linear extension into the skin at 11 o'clock position 6 cm from the nipple. Findings are at the site of the palpable lump associated with the midportion of the lumpectomy scar. Although indistinct, the most focal portion of this mass measures approximately 6 x 10 x 5 mm. Ultrasound of the left axilla is negative for lymphadenopathy.   04/03/2019 Pathology Results   Breast, left, needle core biopsy, 11 o'clock - BENIGN FIBROADIPOSE TISSUE. SEE NOTE - NEGATIVE FOR CARCINOMA   06/07/2019 Pathology Results   FINAL MICROSCOPIC DIAGNOSIS:  A. BREAST, LEFT W/SEED, LUMPECTOMY:  - Benign breast parenchyma with nodular hyaline fibrosis and dystrophic  calcifications  - No evidence of malignancy    01/04/2020 Mammogram   IMPRESSION: No mammographic evidence of malignancy.     FAMILY HISTORY:  We obtained a detailed, 4-generation family history.  Significant diagnoses are listed below:        Family History  Problem Relation Age of Onset   Squamous cell carcinoma Mother 71   Lung cancer Maternal Uncle          dx. 60s, agent orange   Colon cancer Maternal Grandmother 83   Bone cancer Maternal Grandfather     Breast cancer Other     Pancreatic cancer Other     Pancreatic cancer Cousin 13       Ms. Hoston's mother was recently diagnosed with squamous cell carcinoma of the mouth at 59, she had a TAH/BSO at 8. Her maternal cousin was diagnosed with pancreatic cancer  at 20. Her maternal grandmother was diagnosed with colon cancer at 63 and died due to metastasis days later. Multiple sisters of her maternal grandmother had cancer including breast, pancreatic, and stomach cancer. Her maternal grandfather was diagnosed with bone cancer in his 81s and died at 17 due to a MI. Ms. Menendez is unaware of previous family history of genetic testing for hereditary cancer risks. There no reported Ashkenazi Jewish ancestry.   Genetic Test Results:  The [facility]  CancerNext-Expanded (73 gene) Panel found a single pathogenic mutation in the [place] gene. Ms. 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   The CancerNext-Expanded gene panel offered by [facility] and includes sequencing, rearrangement, and RNA analysis for the following 77 genes: AIP, ALK, APC, ATM, AXIN2, BAP1, BARD1, BLM, BMPR1A, BRCA1, BRCA2, BRIP1, CDC73, CDH1, CDK4, CDKN1B, CDKN2A, CHEK2, CTNNA1, DICER1, FANCC, FH, FLCN, GALNT12, KIF1B, LZTR1, MAX, MEN1, MET, MLH1, MSH2, MSH3, MSH6, MUTYH, NBN, NF1, NF2, NTHL1, PALB2, PHOX2B, PMS2, POT1, PRKAR1A, PTCH1, PTEN, RAD51C, RAD51D, RB1, RECQL, RET, SDHA, SDHAF2, SDHB, SDHC, SDHD, SMAD4, SMARCA4, SMARCB1, SMARCE1, STK11, SUFU, TMEM127, TP53, TSC1, TSC2, VHL and XRCC2 (sequencing and deletion/duplication); EGFR, EGLN1, HOXB13, KIT, MITF, PDGFRA, POLD1, and POLE (sequencing only); EPCAM and GREM1 (deletion/duplication only).     The test report has been scanned into EPIC and is located under the Molecular Pathology section of the Results Review tab.  A portion of the result report is included below for reference. Genetic testing reported out on 04/21/2021.       Clinical Information: Changes in the NTHL1 gene are associated with NTHL1-associated polyposis syndrome which is a hereditary cancer condition in which patients have an increased risk for colon polyps and colorectal cancer. NTHL1 is unique among the hereditary cancer syndromes in that it is a "recessive" condition. Most hereditary cancer conditions are "dominant", meaning the condition is present in patients with a mutation in one copy of the gene. Patients have NTHL1-associated polyposis only if there are mutations in both of their copies of the [place] gene. Ms. Rosensteel carries only one gene mutation in 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 She does not have NTHL1-associated polyposis.  Explanation  Even though a  pathogenic variant was not identified that explains her personal and family history of cancer, possible explanations for the cancer in the family may include: There may be no hereditary risk for cancer in the family. The cancers in Ms. Horsey and/or her family may be due to other genetic or environmental factors. There may be a gene mutation in one of these genes that current testing methods cannot detect, but that chance is small. There could be another gene that has not yet been discovered, or that we have not yet tested, that is responsible for the cancer diagnoses in the family.  It is also possible there is a hereditary cause for the cancer in the family that Ms. Khachatryan did not inherit.  Therefore, it is important to remain in touch with cancer genetics in the future so that we can continue to offer Ms. Gillott the most up to date genetic testing.   Additional Genetic Testing:  We discussed with Ms. Rasnick that her genetic testing was fairly extensive.  If there are genes identified to increase cancer risk that can be analyzed in the future, we would be happy to discuss and coordinate this testing at that time.    Cancer Screening Recommendations:  We have not identified a hereditary cause  for her personal and family history of cancer at this time. An individual's cancer risk and medical management are not determined by genetic test results alone. Overall cancer risk assessment incorporates additional factors, including personal medical history, family history, and any available genetic information that may result in a personalized plan for cancer prevention and surveillance. Therefore, it is recommended she continue to follow the cancer management and screening guidelines provided by her oncology and primary healthcare provider.  Implications for Family Members: Ms. Gasner first degree relatives are at a 50% risk for also being carriers.  For there to be a risk of NTHL1-associated  polyposis in offspring, both the patient and their partner would each have to carry a pathogenic variant in [place]; in this case, the risk to have an affected child is 25%. Family members may consider genetic testing for this familial pathogenic variant. They may contact our office at [phone] for more information or to schedule an appointment. Complimentary testing for the familial variant is available for 90 days from the report date. Family members who live outside of the area can find a Dietitian in their area by visiting: [email or web address].   Other members of the family may still carry a pathogenic variant in one of these genes that Ms. Blickenstaff did not inherit. Based on the family history, we recommend her mother consider comprehensive genetic testing due to her family history of multiple relatives with pancreatic cancer.   Follow-Up:  Cancer genetics is a rapidly advancing field and it is possible that new genetic tests will be appropriate for her and/or her family members in the future. We encouraged her to remain in contact with cancer genetics on an annual basis so we can update her personal and family histories and let her know of advances in cancer genetics that may benefit this family.   Our contact number was provided. Ms. Hickling questions were answered to her satisfaction, and she knows she is welcome to call us at anytime with additional questions or concerns.   Lucille Passy, MS, [facility] Genetic Counselor [email or web address] (P) [phone]

## 2021-09-28 ENCOUNTER — Telehealth: Payer: Self-pay | Admitting: Family Medicine

## 2021-09-28 DIAGNOSIS — E7800 Pure hypercholesterolemia, unspecified: Secondary | ICD-10-CM

## 2021-09-28 NOTE — Telephone Encounter (Signed)
-----   Message from Ellamae Sia sent at 09/24/2021 10:39 AM EST ----- ?Regarding: Lab orders for Friday, 3.17.23 ?Patient is scheduled for CPX labs, please order future labs, Thanks , Terri ? ? ?

## 2021-10-02 ENCOUNTER — Other Ambulatory Visit: Payer: Self-pay

## 2021-10-02 ENCOUNTER — Other Ambulatory Visit (INDEPENDENT_AMBULATORY_CARE_PROVIDER_SITE_OTHER): Payer: BC Managed Care – PPO

## 2021-10-02 DIAGNOSIS — E7800 Pure hypercholesterolemia, unspecified: Secondary | ICD-10-CM | POA: Diagnosis not present

## 2021-10-02 LAB — COMPREHENSIVE METABOLIC PANEL
ALT: 15 U/L (ref 0–35)
AST: 20 U/L (ref 0–37)
Albumin: 4.1 g/dL (ref 3.5–5.2)
Alkaline Phosphatase: 67 U/L (ref 39–117)
BUN: 9 mg/dL (ref 6–23)
CO2: 28 mEq/L (ref 19–32)
Calcium: 9.3 mg/dL (ref 8.4–10.5)
Chloride: 106 mEq/L (ref 96–112)
Creatinine, Ser: 0.78 mg/dL (ref 0.40–1.20)
GFR: 83.16 mL/min (ref >60.00)
Glucose, Bld: 94 mg/dL (ref 70–99)
Potassium: 3.8 mEq/L (ref 3.5–5.1)
Sodium: 141 mEq/L (ref 135–145)
Total Bilirubin: 0.7 mg/dL (ref 0.2–1.2)
Total Protein: 7 g/dL (ref 6.0–8.3)

## 2021-10-02 LAB — LIPID PANEL
Cholesterol: 189 mg/dL (ref 0–200)
HDL: 55.6 mg/dL (ref >39.00)
LDL Cholesterol: 115 mg/dL — ABNORMAL HIGH (ref 0–99)
NonHDL: 133.44
Total CHOL/HDL Ratio: 3
Triglycerides: 91 mg/dL (ref 0.0–149.0)
VLDL: 18.2 mg/dL (ref 0.0–40.0)

## 2021-10-02 NOTE — Progress Notes (Signed)
No critical labs need to be addressed urgently. We will discuss labs in detail at upcoming office visit.   

## 2021-10-09 ENCOUNTER — Ambulatory Visit (INDEPENDENT_AMBULATORY_CARE_PROVIDER_SITE_OTHER): Payer: BC Managed Care – PPO | Admitting: Family Medicine

## 2021-10-09 ENCOUNTER — Other Ambulatory Visit: Payer: Self-pay

## 2021-10-09 VITALS — BP 110/78 | HR 74 | Temp 98.8°F | Ht 61.75 in | Wt 146.5 lb

## 2021-10-09 DIAGNOSIS — E7800 Pure hypercholesterolemia, unspecified: Secondary | ICD-10-CM

## 2021-10-09 DIAGNOSIS — Z23 Encounter for immunization: Secondary | ICD-10-CM | POA: Diagnosis not present

## 2021-10-09 DIAGNOSIS — Z0000 Encounter for general adult medical examination without abnormal findings: Secondary | ICD-10-CM

## 2021-10-09 MED ORDER — SIMVASTATIN 40 MG PO TABS: ORAL_TABLET | ORAL | 3 refills | Status: DC

## 2021-10-09 NOTE — Progress Notes (Signed)
? ? Patient ID: [name], female    DOB: [birth year], 61 y.o.   MRN: [id number] ? ?This visit was conducted in person. ? ?BP 110/78   Pulse 74   Temp 98.8 ?F (37.1 ?C) (Oral)   Ht 5' 1.75" (1.568 m)   Wt 146 lb 8 oz (66.5 kg)   SpO2 97%   BMI 27.01 kg/m?   ? ?Wt Readings from Last 3 Encounters:  ?10/09/21 146 lb 8 oz (66.5 kg)  ?03/20/21 147 lb (66.7 kg)  ?10/03/20 146 lb 12 oz (66.6 kg)  ? ? ?CC:  ?Chief Complaint  ?Patient presents with  ? Annual Exam  ?  No concerns  ? ? ?Subjective:  ? ?HPI: ?[name] is a 61 y.o. female presenting on 10/09/2021 for Annual Exam (No concerns) ? ?Elevated Cholesterol:  good control on simvastatin 40 mg  ever yother day ?Lab Results  ?Component Value Date  ? CHOL 189 10/02/2021  ? HDL 55.60 10/02/2021  ? LDLCALC 115 (H) 10/02/2021  ? LDLDIRECT 168.3 07/15/2008  ? TRIG 91.0 10/02/2021  ? CHOLHDL 3 10/02/2021  ?The 10-year ASCVD risk score (Arnett DK, et al., 2019) is: 2.1% ?  Values used to calculate the score: ?    Age: 61 years ?    Sex: Female ?    Is Non-Hispanic African American: No ?    Diabetic: No ?    Tobacco smoker: No ?    Systolic Blood Pressure: 751 mmHg ?    Is BP treated: No ?    HDL Cholesterol: 55.6 mg/dL ?    Total Cholesterol: 189 mg/dL ?Using medications without problems: ?Muscle aches:  ?Diet compliance: moderate ?Exercise: walking daily ?Other complaints: ? ? ?   ? ?Relevant past medical, surgical, family and social history reviewed and updated as indicated. Interim medical history since our last visit reviewed. ?Allergies and medications reviewed and updated. ?Outpatient Medications Prior to Visit  ?Medication Sig Dispense Refill  ? Calcium-Phosphorus-Vitamin D (CALCIUM/D3 ADULT GUMMIES PO) Take 2-3 each by mouth daily. Vitafusion    ? OVER THE COUNTER MEDICATION Vita Fusion 500 mg daily    ? simvastatin (ZOCOR) 40 MG tablet TAKE 1 TABLET BY MOUTH EVERY OTHER NIGHT 45 tablet 1  ? ?No facility-administered medications prior to visit.  ?   ? ?Per HPI unless specifically indicated in ROS section below ?Review of Systems  ?Constitutional:  Negative for fatigue and fever.  ?HENT:  Negative for congestion.   ?Eyes:  Negative for pain.  ?Respiratory:  Negative for cough and shortness of breath.   ?Cardiovascular:  Negative for chest pain, palpitations and leg swelling.  ?Gastrointestinal:  Negative for abdominal pain.  ?Genitourinary:  Negative for dysuria and vaginal bleeding.  ?Musculoskeletal:  Negative for back pain.  ?Neurological:  Negative for syncope, light-headedness and headaches.  ?Psychiatric/Behavioral:  Negative for dysphoric mood.   ?Objective:  ?BP 110/78   Pulse 74   Temp 98.8 ?F (37.1 ?C) (Oral)   Ht 5' 1.75" (1.568 m)   Wt 146 lb 8 oz (66.5 kg)   SpO2 97%   BMI 27.01 kg/m?   ?Wt Readings from Last 3 Encounters:  ?10/09/21 146 lb 8 oz (66.5 kg)  ?03/20/21 147 lb (66.7 kg)  ?10/03/20 146 lb 12 oz (66.6 kg)  ?  ?  ?Physical Exam ?Vitals reviewed.  ?Constitutional:   ?   General: She is not in acute distress. ?   Appearance: Normal appearance. She is well-developed. She is not ill-appearing  or toxic-appearing.  ?HENT:  ?   Head: Normocephalic.  ?   Right Ear: Hearing, tympanic membrane, ear canal and external ear normal.  ?   Left Ear: Hearing, tympanic membrane, ear canal and external ear normal.  ?   Nose: Nose normal.  ?Eyes:  ?   General: Lids are normal. Lids are everted, no foreign bodies appreciated.  ?   Conjunctiva/sclera: Conjunctivae normal.  ?   Pupils: Pupils are equal, round, and reactive to light.  ?Neck:  ?   Thyroid: No thyroid mass or thyromegaly.  ?   Vascular: No carotid bruit.  ?   Trachea: Trachea normal.  ?Cardiovascular:  ?   Rate and Rhythm: Normal rate and regular rhythm.  ?   Heart sounds: Normal heart sounds, S1 normal and S2 normal. No murmur heard. ?  No gallop.  ?Pulmonary:  ?   Effort: Pulmonary effort is normal. No respiratory distress.  ?   Breath sounds: Normal breath sounds. No wheezing, rhonchi or  rales.  ?Abdominal:  ?   General: Bowel sounds are normal. There is no distension or abdominal bruit.  ?   Palpations: Abdomen is soft. There is no fluid wave or mass.  ?   Tenderness: There is no abdominal tenderness. There is no guarding or rebound.  ?   Hernia: No hernia is present.  ?Musculoskeletal:  ?   Cervical back: Normal range of motion and neck supple.  ?Lymphadenopathy:  ?   Cervical: No cervical adenopathy.  ?Skin: ?   General: Skin is warm and dry.  ?   Findings: No rash.  ?Neurological:  ?   Mental Status: She is alert.  ?   Cranial Nerves: No cranial nerve deficit.  ?   Sensory: No sensory deficit.  ?Psychiatric:     ?   Mood and Affect: Mood is not anxious or depressed.     ?   Speech: Speech normal.     ?   Behavior: Behavior normal. Behavior is cooperative.     ?   Judgment: Judgment normal.  ? ?   ?Results for orders placed or performed in visit on 10/02/21  ?Comprehensive metabolic panel  ?Result Value Ref Range  ? Sodium 141 135 - 145 mEq/L  ? Potassium 3.8 3.5 - 5.1 mEq/L  ? Chloride 106 96 - 112 mEq/L  ? CO2 28 19 - 32 mEq/L  ? Glucose, Bld 94 70 - 99 mg/dL  ? BUN 9 6 - 23 mg/dL  ? Creatinine, Ser 0.78 0.40 - 1.20 mg/dL  ? Total Bilirubin 0.7 0.2 - 1.2 mg/dL  ? Alkaline Phosphatase 67 39 - 117 U/L  ? AST 20 0 - 37 U/L  ? ALT 15 0 - 35 U/L  ? Total Protein 7.0 6.0 - 8.3 g/dL  ? Albumin 4.1 3.5 - 5.2 g/dL  ? GFR 83.16 >60.00 mL/min  ? Calcium 9.3 8.4 - 10.5 mg/dL  ?Lipid panel  ?Result Value Ref Range  ? Cholesterol 189 0 - 200 mg/dL  ? Triglycerides 91.0 0.0 - 149.0 mg/dL  ? HDL 55.60 >39.00 mg/dL  ? VLDL 18.2 0.0 - 40.0 mg/dL  ? LDL Cholesterol 115 (H) 0 - 99 mg/dL  ? Total CHOL/HDL Ratio 3   ? NonHDL 133.44   ? ? ?This visit occurred during the SARS-CoV-2 public health emergency.  Safety protocols were in place, including screening questions prior to the visit, additional usage of staff PPE, and extensive cleaning of exam room while  observing appropriate contact time as indicated for  disinfecting solutions.  ? ?COVID 19 screen:  No recent travel or known exposure to [place] ?The patient denies respiratory symptoms of COVID 19 at this time. ?The importance of social distancing was discussed today.  ? ?Assessment and Plan ? ? The patient's preventative maintenance and recommended screening tests for an annual wellness exam were reviewed in full today. ?Brought up to date unless services declined. ? ?Counselled on the importance of diet, exercise, and its role in overall health and mortality. ?The patient's FH and SH was reviewed, including their home life, tobacco status, and drug and alcohol status.  ? ?Continue DVE yearly given uterus still present.  S/P BSO,  Ovary eval not indicated. Has cervix PAPs every 5 years, last nml 2020, neg HPV   ?Mammo: 2008 breast cancer, Released by ONC  nml 12/2020 ?DEXA: 10/2012: osteopenia (but was high risk since on Arimidex, now off arimidex for 3 years, she was on fosamax ( was on for almost 2 years) to prevent deterioration, but has stopped now)  Stable DEXA in   11/2014 osteopenia, normal in 2018 and 2020.. repeat in 5 years. ?Vaccines: COVID x 2 , due for tdap, consider shingrix. ?Refused HIV screen. ? Hep C screen: done    ?Colonoscopy 05/2020 polyp, Dr. Fuller Plan repeat in 7 years ? ?Problem List Items Addressed This Visit   ? ? HYPERCHOLESTEROLEMIA  ?  Chronic, well controlled ?Continue simvastatin 40 mg every other day.  Refills given for 1 year ?  ?  ? Relevant Medications  ? simvastatin (ZOCOR) 40 MG tablet  ? ?Other Visit Diagnoses   ? ? Routine general medical examination at a health care facility    -  Primary  ? Need for Td vaccine      ? Relevant Orders  ? Td vaccine greater than or equal to 7yo preservative free IM (Completed)  ? ?  ? ?Meds ordered this encounter  ?Medications  ? simvastatin (ZOCOR) 40 MG tablet  ?  Sig: TAKE 1 TABLET BY MOUTH EVERY OTHER NIGHT  ?  Dispense:  45 tablet  ?  Refill:  3  ? ? ?Eliezer Lofts, MD  ? ?

## 2021-10-09 NOTE — Assessment & Plan Note (Signed)
Chronic, well controlled ?Continue simvastatin 40 mg every other day.  Refills given for 1 year ?

## 2021-12-04 ENCOUNTER — Other Ambulatory Visit: Payer: Self-pay | Admitting: Family Medicine

## 2021-12-04 DIAGNOSIS — Z1231 Encounter for screening mammogram for malignant neoplasm of breast: Secondary | ICD-10-CM

## 2021-12-30 ENCOUNTER — Encounter: Payer: BC Managed Care – PPO | Admitting: Dermatology

## 2022-01-11 ENCOUNTER — Ambulatory Visit
Admission: RE | Admit: 2022-01-11 | Discharge: 2022-01-11 | Disposition: A | Discharge status: Home / Self Care | Payer: BC Managed Care – PPO | Source: Ambulatory Visit | Attending: Family Medicine | Admitting: Family Medicine

## 2022-01-11 DIAGNOSIS — Z1231 Encounter for screening mammogram for malignant neoplasm of breast: Secondary | ICD-10-CM

## 2022-02-03 ENCOUNTER — Encounter: Payer: BC Managed Care – PPO | Admitting: Dermatology

## 2022-03-03 ENCOUNTER — Ambulatory Visit (INDEPENDENT_AMBULATORY_CARE_PROVIDER_SITE_OTHER): Payer: BC Managed Care – PPO | Admitting: Dermatology

## 2022-03-03 DIAGNOSIS — L814 Other melanin hyperpigmentation: Secondary | ICD-10-CM | POA: Diagnosis not present

## 2022-03-03 DIAGNOSIS — Z86018 Personal history of other benign neoplasm: Secondary | ICD-10-CM

## 2022-03-03 DIAGNOSIS — D1800 Hemangioma unspecified site: Secondary | ICD-10-CM

## 2022-03-03 DIAGNOSIS — D229 Melanocytic nevi, unspecified: Secondary | ICD-10-CM

## 2022-03-03 DIAGNOSIS — L578 Other skin changes due to chronic exposure to nonionizing radiation: Secondary | ICD-10-CM

## 2022-03-03 DIAGNOSIS — L821 Other seborrheic keratosis: Secondary | ICD-10-CM | POA: Diagnosis not present

## 2022-03-03 DIAGNOSIS — Z1283 Encounter for screening for malignant neoplasm of skin: Secondary | ICD-10-CM | POA: Diagnosis not present

## 2022-03-03 DIAGNOSIS — L570 Actinic keratosis: Secondary | ICD-10-CM | POA: Diagnosis not present

## 2022-03-03 NOTE — Progress Notes (Signed)
Follow-Up Visit   Subjective  [name] is a 61 y.o. female who presents for the following: Annual Exam (AMP at mid chest. Cannot exclude early evolving LM.  Excised 08/30/2018. Margins free. Patient has noticed a new and changing lesion on her L forehead that she is concerned about and would like checked today).   The patient presents for Total-Body Skin Exam (TBSE) for skin cancer screening and mole check.  The patient has spots, moles and lesions to be evaluated, some may be new or changing.   The following portions of the chart were reviewed this encounter and updated as appropriate:   Tobacco  Allergies  Meds  Problems  Med Hx  Surg Hx  Fam Hx      Review of Systems:  No other skin or systemic complaints except as noted in HPI or Assessment and Plan.  Objective  Well appearing patient in no apparent distress; mood and affect are within normal limits.  A full examination was performed including scalp, head, eyes, ears, nose, lips, neck, chest, axillae, abdomen, back, buttocks, bilateral upper extremities, bilateral lower extremities, hands, feet, fingers, toes, fingernails, and toenails. All findings within normal limits unless otherwise noted below.  L sup breast x 1 Erythematous thin papules/macules with gritty scale.     Assessment & Plan  AK (actinic keratosis) L sup breast x 1  Actinic keratoses are precancerous spots that appear secondary to cumulative UV radiation exposure/sun exposure over time. They are chronic with expected duration over 1 year. A portion of actinic keratoses will progress to squamous cell carcinoma of the skin. It is not possible to reliably predict which spots will progress to skin cancer and so treatment is recommended to prevent development of skin cancer.  Recommend daily broad spectrum sunscreen SPF 30+ to sun-exposed areas, reapply every 2 hours as needed.  Recommend staying in the shade or wearing long sleeves, sun glasses (UVA+UVB  protection) and wide brim hats (4-inch brim around the entire circumference of the hat). Call for new or changing lesions.  Prior to procedure, discussed risks of blister formation, small wound, skin dyspigmentation, or rare scar following cryotherapy. Recommend Vaseline ointment to treated areas while healing.   Destruction of lesion - L sup breast x 1 Complexity: simple   Destruction method: cryotherapy   Informed consent: discussed and consent obtained   Timeout:  patient name, date of birth, surgical site, and procedure verified Lesion destroyed using liquid nitrogen: Yes   Region frozen until ice ball extended beyond lesion: Yes   Outcome: patient tolerated procedure well with no complications   Post-procedure details: wound care instructions given     Lentigines - Scattered tan macules - Due to sun exposure - Benign-appearing, observe - Recommend daily broad spectrum sunscreen SPF 30+ to sun-exposed areas, reapply every 2 hours as needed. - Call for any changes  Seborrheic Keratoses - L forehead - Stuck-on, waxy, tan-brown papules and/or plaques  - Benign-appearing - Discussed benign etiology and prognosis. - Observe - Call for any changes  Melanocytic Nevi - Tan-brown and/or pink-flesh-colored symmetric macules and papules - Benign appearing on exam today - Observation - Call clinic for new or changing moles - Recommend daily use of broad spectrum spf 30+ sunscreen to sun-exposed areas.   Hemangiomas - Red papules - Discussed benign nature - Observe - Call for any changes  Actinic Damage - Chronic condition, secondary to cumulative UV/sun exposure - diffuse scaly erythematous macules with underlying dyspigmentation - Recommend daily broad  spectrum sunscreen SPF 30+ to sun-exposed areas, reapply every 2 hours as needed.  - Staying in the shade or wearing long sleeves, sun glasses (UVA+UVB protection) and wide brim hats (4-inch brim around the entire circumference  of the hat) are also recommended for sun protection.  - Call for new or changing lesions.  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 Cannot exclude early evolving LM.  Excised 08/30/2018. Margins free.  - No evidence of recurrence today - Recommend regular full body skin exams - Recommend daily broad spectrum sunscreen SPF 30+ to sun-exposed areas, reapply every 2 hours as needed.  - Call if any new or changing lesions are noted between office visits - No LAD.   Skin cancer screening performed today.  Return in about 1 year (around 03/04/2023) for TBSE.  Luther Redo, CMA, am acting as scribe for Forest Gleason, MD .  Documentation: I have reviewed the above documentation for accuracy and completeness, and I agree with the above.  Forest Gleason, MD

## 2022-03-03 NOTE — Patient Instructions (Addendum)

## 2022-03-13 ENCOUNTER — Encounter: Payer: Self-pay | Admitting: Dermatology

## 2022-09-17 ENCOUNTER — Telehealth: Payer: Self-pay | Admitting: *Deleted

## 2022-09-17 DIAGNOSIS — E7800 Pure hypercholesterolemia, unspecified: Secondary | ICD-10-CM

## 2022-09-17 NOTE — Telephone Encounter (Signed)
-----   Message from Velna Hatchet, RT sent at 09/13/2022 12:34 PM EST ----- Regarding: Thu 3/14 lab Patient is scheduled for cpx, please order future labs.  Thanks, Anda Kraft

## 2022-09-29 ENCOUNTER — Other Ambulatory Visit (HOSPITAL_COMMUNITY): Payer: Self-pay

## 2022-09-30 ENCOUNTER — Other Ambulatory Visit: Payer: BC Managed Care – PPO

## 2022-10-04 ENCOUNTER — Other Ambulatory Visit (INDEPENDENT_AMBULATORY_CARE_PROVIDER_SITE_OTHER): Payer: BC Managed Care – PPO

## 2022-10-04 DIAGNOSIS — E7800 Pure hypercholesterolemia, unspecified: Secondary | ICD-10-CM | POA: Diagnosis not present

## 2022-10-04 LAB — COMPREHENSIVE METABOLIC PANEL
ALT: 14 U/L (ref 0–35)
AST: 22 U/L (ref 0–37)
Albumin: 3.8 g/dL (ref 3.5–5.2)
Alkaline Phosphatase: 66 U/L (ref 39–117)
BUN: 9 mg/dL (ref 6–23)
CO2: 26 mEq/L (ref 19–32)
Calcium: 8.7 mg/dL (ref 8.4–10.5)
Chloride: 108 mEq/L (ref 96–112)
Creatinine, Ser: 0.77 mg/dL (ref 0.40–1.20)
GFR: 83.86 mL/min (ref >60.00)
Glucose, Bld: 91 mg/dL (ref 70–99)
Potassium: 3.7 mEq/L (ref 3.5–5.1)
Sodium: 141 mEq/L (ref 135–145)
Total Bilirubin: 0.7 mg/dL (ref 0.2–1.2)
Total Protein: 6.6 g/dL (ref 6.0–8.3)

## 2022-10-04 LAB — LIPID PANEL
Cholesterol: 205 mg/dL — ABNORMAL HIGH (ref 0–200)
HDL: 52.4 mg/dL (ref >39.00)
LDL Cholesterol: 134 mg/dL — ABNORMAL HIGH (ref 0–99)
NonHDL: 152.36
Total CHOL/HDL Ratio: 4
Triglycerides: 93 mg/dL (ref 0.0–149.0)
VLDL: 18.6 mg/dL (ref 0.0–40.0)

## 2022-10-05 NOTE — Progress Notes (Signed)
No critical labs need to be addressed urgently. We will discuss labs in detail at upcoming office visit.   

## 2022-10-12 ENCOUNTER — Encounter: Payer: Self-pay | Admitting: Family Medicine

## 2022-10-12 ENCOUNTER — Ambulatory Visit (INDEPENDENT_AMBULATORY_CARE_PROVIDER_SITE_OTHER): Payer: BC Managed Care – PPO | Admitting: Family Medicine

## 2022-10-12 VITALS — BP 132/74 | HR 70 | Temp 97.9°F | Ht 62.25 in | Wt 148.4 lb

## 2022-10-12 DIAGNOSIS — Z0000 Encounter for general adult medical examination without abnormal findings: Secondary | ICD-10-CM | POA: Diagnosis not present

## 2022-10-12 DIAGNOSIS — E7800 Pure hypercholesterolemia, unspecified: Secondary | ICD-10-CM

## 2022-10-12 NOTE — Assessment & Plan Note (Addendum)
Chronic, moderately well controlled Continue simvastatin 40 mg every other day. ( Try to be more consistent)  Refills given for 1 year

## 2022-10-12 NOTE — Progress Notes (Signed)
Patient ID: [name], female    DOB: 11/26/1960, 62 y.o.   MRN: [id number]  This visit was conducted in person.  BP 132/74   Pulse 70   Temp 97.9 F (36.6 C) (Temporal)   Ht 5' 2.25" (1.581 m)   Wt 148 lb 6 oz (67.3 kg)   SpO2 98%   BMI 26.92 kg/m    Wt Readings from Last 3 Encounters:  10/12/22 148 lb 6 oz (67.3 kg)  10/09/21 146 lb 8 oz (66.5 kg)  03/20/21 147 lb (66.7 kg)    CC:  Chief Complaint  Patient presents with   Annual Exam    Subjective:   HPI: [name] is a 62 y.o. female presenting on 10/12/2022 for Annual Exam  The patient presents for complete physical and review of chronic health problems. He/She also has the following acute concerns today: none   Elevated Cholesterol:   alight worsened control on simvastatin 40 mg  every other day, very stong family history of high cholesterol. Lab Results  Component Value Date   CHOL 205 (H) 10/04/2022   HDL 52.40 10/04/2022   LDLCALC 134 (H) 10/04/2022   LDLDIRECT 168.3 07/15/2008   TRIG 93.0 10/04/2022   CHOLHDL 4 10/04/2022  The 10-year ASCVD risk score (Arnett DK, et al., 2019) is: 3.7%   Values used to calculate the score:     Age: 18 years     Sex: Female     Is Non-Hispanic African American: No     Diabetic: No     Tobacco smoker: No     Systolic Blood Pressure: [id number] mmHg     Is BP treated: No     HDL Cholesterol: 52.4 mg/dL     Total Cholesterol: 205 mg/dL Using medications without problems: none Muscle aches:  none Diet compliance: moderate Exercise: walking daily Other complaints:     Relevant past medical, surgical, family and social history reviewed and updated as indicated. Interim medical history since our last visit reviewed. Allergies and medications reviewed and updated. Outpatient Medications Prior to Visit  Medication Sig Dispense Refill   Calcium-Phosphorus-Vitamin D (CALCIUM/D3 ADULT GUMMIES PO) Take 2-3 each by mouth daily. Vitafusion     OVER THE COUNTER  MEDICATION Vita Fusion 500 mg daily     simvastatin (ZOCOR) 40 MG tablet TAKE 1 TABLET BY MOUTH EVERY OTHER NIGHT 45 tablet 3   No facility-administered medications prior to visit.     Per HPI unless specifically indicated in ROS section below Review of Systems  Constitutional:  Negative for fatigue and fever.  HENT:  Negative for congestion.   Eyes:  Negative for pain.  Respiratory:  Negative for cough and shortness of breath.   Cardiovascular:  Negative for chest pain, palpitations and leg swelling.  Gastrointestinal:  Negative for abdominal pain.  Genitourinary:  Negative for dysuria and vaginal bleeding.  Musculoskeletal:  Negative for back pain.  Neurological:  Negative for syncope, light-headedness and headaches.  Psychiatric/Behavioral:  Negative for dysphoric mood.    Objective:  BP 132/74   Pulse 70   Temp 97.9 F (36.6 C) (Temporal)   Ht 5' 2.25" (1.581 m)   Wt 148 lb 6 oz (67.3 kg)   SpO2 98%   BMI 26.92 kg/m   Wt Readings from Last 3 Encounters:  10/12/22 148 lb 6 oz (67.3 kg)  10/09/21 146 lb 8 oz (66.5 kg)  03/20/21 147 lb (66.7 kg)      Physical Exam Vitals reviewed.  Constitutional:      General: She is not in acute distress.    Appearance: Normal appearance. She is well-developed. She is not ill-appearing or toxic-appearing.  HENT:     Head: Normocephalic.     Right Ear: Hearing, tympanic membrane, ear canal and external ear normal.     Left Ear: Hearing, tympanic membrane, ear canal and external ear normal.     Nose: Nose normal.  Eyes:     General: Lids are normal. Lids are everted, no foreign bodies appreciated.     Conjunctiva/sclera: Conjunctivae normal.     Pupils: Pupils are equal, round, and reactive to light.  Neck:     Thyroid: No thyroid mass or thyromegaly.     Vascular: No carotid bruit.     Trachea: Trachea normal.  Cardiovascular:     Rate and Rhythm: Normal rate and regular rhythm.     Heart sounds: Normal heart sounds, S1 normal  and S2 normal. No murmur heard.    No gallop.  Pulmonary:     Effort: Pulmonary effort is normal. No respiratory distress.     Breath sounds: Normal breath sounds. No wheezing, rhonchi or rales.  Abdominal:     General: Bowel sounds are normal. There is no distension or abdominal bruit.     Palpations: Abdomen is soft. There is no fluid wave or mass.     Tenderness: There is no abdominal tenderness. There is no guarding or rebound.     Hernia: No hernia is present.  Musculoskeletal:     Cervical back: Normal range of motion and neck supple.  Lymphadenopathy:     Cervical: No cervical adenopathy.  Skin:    General: Skin is warm and dry.     Findings: No rash.  Neurological:     Mental Status: She is alert.     Cranial Nerves: No cranial nerve deficit.     Sensory: No sensory deficit.  Psychiatric:        Mood and Affect: Mood is not anxious or depressed.        Speech: Speech normal.        Behavior: Behavior normal. Behavior is cooperative.        Judgment: Judgment normal.       Results for orders placed or performed in visit on 10/04/22  Lipid panel  Result Value Ref Range   Cholesterol 205 (H) 0 - 200 mg/dL   Triglycerides 93.0 0.0 - 149.0 mg/dL   HDL 52.40 >39.00 mg/dL   VLDL 18.6 0.0 - 40.0 mg/dL   LDL Cholesterol 134 (H) 0 - 99 mg/dL   Total CHOL/HDL Ratio 4    NonHDL 152.36   Comprehensive metabolic panel  Result Value Ref Range   Sodium 141 135 - 145 mEq/L   Potassium 3.7 3.5 - 5.1 mEq/L   Chloride 108 96 - 112 mEq/L   CO2 26 19 - 32 mEq/L   Glucose, Bld 91 70 - 99 mg/dL   BUN 9 6 - 23 mg/dL   Creatinine, Ser 0.77 0.40 - 1.20 mg/dL   Total Bilirubin 0.7 0.2 - 1.2 mg/dL   Alkaline Phosphatase 66 39 - 117 U/L   AST 22 0 - 37 U/L   ALT 14 0 - 35 U/L   Total Protein 6.6 6.0 - 8.3 g/dL   Albumin 3.8 3.5 - 5.2 g/dL   GFR 83.86 >60.00 mL/min   Calcium 8.7 8.4 - 10.5 mg/dL    This visit occurred during the  SARS-CoV-2 public health emergency.  Safety protocols  were in place, including screening questions prior to the visit, additional usage of staff PPE, and extensive cleaning of exam room while observing appropriate contact time as indicated for disinfecting solutions.   COVID 19 screen:  No recent travel or known exposure to COVID19 The patient denies respiratory symptoms of COVID 19 at this time. The importance of social distancing was discussed today.   Assessment and Plan   The patient's preventative maintenance and recommended screening tests for an annual wellness exam were reviewed in full today. Brought up to date unless services declined.  Counselled on the importance of diet, exercise, and its role in overall health and mortality. The patient's FH and SH was reviewed, including their home life, tobacco status, and drug and alcohol status.   Continue DVE yearly given uterus still present.  S/P BSO,  Ovary eval not indicated. Has cervix PAPs every 5 years, last nml 2020, neg HPV   Mammo: 2008 breast cancer, Released by ONC  nml 12/2021 DEXA: 10/2012: osteopenia (but was high risk since on Arimidex, now off arimidex for 3 years, she was on fosamax ( was on for almost 2 years) to prevent deterioration, but has stopped now)  Stable DEXA in   11/2014 osteopenia, normal in 2018 and 2020.. repeat in 5 years. Vaccines: COVID x 2 , uptodate tdap, consider shingrix. Refused HIV screen.  Hep C screen: done    Colonoscopy 05/2020 polyp, Dr. Fuller Plan repeat in 7 years  Problem List Items Addressed This Visit     HYPERCHOLESTEROLEMIA    Chronic, moderately well controlled Continue simvastatin 40 mg every other day. ( Try to be more consistent)  Refills given for 1 year      Other Visit Diagnoses     Routine general medical examination at a health care facility    -  Primary     No orders of the defined types were placed in this encounter.   Eliezer Lofts, MD

## 2022-10-12 NOTE — Patient Instructions (Addendum)
Work on low cholesterol dieta and keep with exercise.

## 2022-10-13 ENCOUNTER — Other Ambulatory Visit: Payer: Self-pay | Admitting: Family Medicine

## 2022-10-13 DIAGNOSIS — Z1231 Encounter for screening mammogram for malignant neoplasm of breast: Secondary | ICD-10-CM

## 2022-12-17 ENCOUNTER — Other Ambulatory Visit: Payer: Self-pay | Admitting: Family Medicine

## 2023-01-17 ENCOUNTER — Ambulatory Visit
Admission: RE | Admit: 2023-01-17 | Discharge: 2023-01-17 | Disposition: A | Discharge status: Home / Self Care | Payer: BC Managed Care – PPO | Source: Ambulatory Visit | Attending: Family Medicine | Admitting: Family Medicine

## 2023-01-17 DIAGNOSIS — Z1231 Encounter for screening mammogram for malignant neoplasm of breast: Secondary | ICD-10-CM

## 2023-03-07 ENCOUNTER — Ambulatory Visit: Payer: BC Managed Care – PPO | Admitting: Dermatology

## 2023-03-07 ENCOUNTER — Encounter: Payer: Self-pay | Admitting: Dermatology

## 2023-03-07 VITALS — BP 138/87 | HR 90

## 2023-03-07 DIAGNOSIS — L578 Other skin changes due to chronic exposure to nonionizing radiation: Secondary | ICD-10-CM

## 2023-03-07 DIAGNOSIS — Z86018 Personal history of other benign neoplasm: Secondary | ICD-10-CM

## 2023-03-07 DIAGNOSIS — Z1283 Encounter for screening for malignant neoplasm of skin: Secondary | ICD-10-CM | POA: Diagnosis not present

## 2023-03-07 DIAGNOSIS — L821 Other seborrheic keratosis: Secondary | ICD-10-CM | POA: Diagnosis not present

## 2023-03-07 DIAGNOSIS — L814 Other melanin hyperpigmentation: Secondary | ICD-10-CM

## 2023-03-07 DIAGNOSIS — D1801 Hemangioma of skin and subcutaneous tissue: Secondary | ICD-10-CM

## 2023-03-07 DIAGNOSIS — L719 Rosacea, unspecified: Secondary | ICD-10-CM

## 2023-03-07 DIAGNOSIS — W908XXA Exposure to other nonionizing radiation, initial encounter: Secondary | ICD-10-CM

## 2023-03-07 DIAGNOSIS — D229 Melanocytic nevi, unspecified: Secondary | ICD-10-CM

## 2023-03-07 DIAGNOSIS — L910 Hypertrophic scar: Secondary | ICD-10-CM

## 2023-03-07 NOTE — Progress Notes (Addendum)
   Follow-Up Visit   Subjective  [name] is a 62 y.o. female who presents for the following: Skin Cancer Screening and Full Body Skin Exam, Hx of Dysplastic nevus.   The patient presents for Total-Body Skin Exam (TBSE) for skin cancer screening and mole check. The patient has spots, moles and lesions to be evaluated, some may be new or changing and the patient may have concern these could be cancer.    The following portions of the chart were reviewed this encounter and updated as appropriate: medications, allergies, medical history  Review of Systems:  No other skin or systemic complaints except as noted in HPI or Assessment and Plan.  Objective  Well appearing patient in no apparent distress; mood and affect are within normal limits.  A full examination was performed including scalp, head, eyes, ears, nose, lips, neck, chest, axillae, abdomen, back, buttocks, bilateral upper extremities, bilateral lower extremities, hands, feet, fingers, toes, fingernails, and toenails. All findings within normal limits unless otherwise noted below.   Relevant physical exam findings are noted in the Assessment and Plan. Exam of nails limited by presence of nail polish. Exam of face limited by presence of make-up.   Assessment & Plan   SKIN CANCER SCREENING PERFORMED TODAY.  ACTINIC DAMAGE - Chronic condition, secondary to cumulative UV/sun exposure - diffuse scaly erythematous macules with underlying dyspigmentation - Recommend daily broad spectrum sunscreen SPF 30+ to sun-exposed areas, reapply every 2 hours as needed.  - Staying in the shade or wearing long sleeves, sun glasses (UVA+UVB protection) and wide brim hats (4-inch brim around the entire circumference of the hat) are also recommended for sun protection.  - Call for new or changing lesions.  LENTIGINES, SEBORRHEIC KERATOSES, HEMANGIOMAS - Benign normal skin lesions - Benign-appearing - Call for any changes  MELANOCYTIC  NEVI - Tan-brown and/or pink-flesh-colored symmetric macules and papules - Benign appearing on exam today - Observation - Call clinic for new or changing moles - Recommend daily use of broad spectrum spf 30+ sunscreen to sun-exposed areas.    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 It is treatable but not curable. It sometimes affects the eyes (ocular rosacea) as well. It may respond to topical and/or systemic medication and can flare with stress, sun exposure, alcohol, exercise, topical steroids (including hydrocortisone/cortisone 10) and some foods.  Daily application of broad spectrum spf 30+ sunscreen to face is recommended to reduce flares.  Treatment Plan The patient will observe these symptoms, and report promptly (1 month) any worsening or unexpected persistence.  If well, may return prn.     SCAR/HISTORY OF DYSPLASTIC NEVUS Central chest hypertrophic scar   No evidence of recurrence today Recommend regular full body skin exams We could try ILK injections if scar becomes bothersome.  Recommend daily broad spectrum sunscreen SPF 30+ to sun-exposed areas, reapply every 2 hours as needed.  Call if any new or changing lesions are noted between office visits    Recommend Coolibar sun protective clothing.   Return in about 1 year (around 03/06/2024) for TBSE, hx of Dysplastic nevus .  IAngelique Holm, CMA, am acting as scribe for Elie Goody, MD .   Documentation: I have reviewed the above documentation for accuracy and completeness, and I agree with the above.  Elie Goody, MD

## 2023-03-07 NOTE — Patient Instructions (Addendum)
Recommend Coolibar sun protective clothing.    Due to recent changes in healthcare laws, you may see results of your pathology and/or laboratory studies on MyChart before the doctors have had a chance to review them. We understand that in some cases there may be results that are confusing or concerning to you. Please understand that not all results are received at the same time and often the doctors may need to interpret multiple results in order to provide you with the best plan of care or course of treatment. Therefore, we ask that you please give [place] 2 business days to thoroughly review all your results before contacting the office for clarification. Should we see a critical lab result, you will be contacted sooner.   If You Need Anything After Your Visit  If you have any questions or concerns for your doctor, please call our main line at [phone] and press option 4 to reach your doctor's medical assistant. If no one answers, please leave a voicemail as directed and we will return your call as soon as possible. Messages left after 4 pm will be answered the following business day.   You may also send [place] a message via MyChart. We typically respond to MyChart messages within 1-2 business days.  For prescription refills, please ask your pharmacy to contact our office. Our fax number is [phone].  If you have an urgent issue when the clinic is closed that cannot wait until the next business day, you can page your doctor at the number below.    Please note that while we do our best to be available for urgent issues outside of office hours, we are not available 24/7.   If you have an urgent issue and are unable to reach [place], you may choose to seek medical care at your doctor's office, retail clinic, urgent care center, or emergency room.  If you have a medical emergency, please immediately call 911 or go to the emergency department.  Pager Numbers  - Dr. Gwen Pounds: [phone]  - Dr.  Roseanne Reno: [phone]  - Dr. Katrinka Blazing: [phone]   In the event of inclement weather, please call our main line at [phone] for an update on the status of any delays or closures.  Dermatology Medication Tips: Please keep the boxes that topical medications come in in order to help keep track of the instructions about where and how to use these. Pharmacies typically print the medication instructions only on the boxes and not directly on the medication tubes.   If your medication is too expensive, please contact our office at [phone] option 4 or send [place] a message through MyChart.   We are unable to tell what your co-pay for medications will be in advance as this is different depending on your insurance coverage. However, we may be able to find a substitute medication at lower cost or fill out paperwork to get insurance to cover a needed medication.   If a prior authorization is required to get your medication covered by your insurance company, please allow [place] 1-2 business days to complete this process.  Drug prices often vary depending on where the prescription is filled and some pharmacies may offer cheaper prices.  The website www.goodrx.com contains coupons for medications through different pharmacies. The prices here do not account for what the cost may be with help from insurance (it may be cheaper with your insurance), but the website can give you the price if you did not use any insurance.  - You  can print the associated coupon and take it with your prescription to the pharmacy.  - You may also stop by our office during regular business hours and pick up a GoodRx coupon card.  - If you need your prescription sent electronically to a different pharmacy, notify our office through [facility] or by phone at [phone] option 4.     Si Usted Necesita Algo Despus de Su Visita  Tambin puede enviarnos un mensaje a travs de Clinical cytogeneticist. Por lo general respondemos a los  mensajes de MyChart en el transcurso de 1 a 2 das hbiles.  Para renovar recetas, por favor pida a su farmacia que se ponga en contacto con nuestra oficina. Annie Sable de fax es [place] [phone].  Si tiene un asunto urgente cuando la clnica est cerrada y que no puede esperar hasta el siguiente da hbil, puede llamar/localizar a su doctor(a) al nmero que aparece a continuacin.   Por favor, tenga en cuenta que aunque hacemos todo lo posible para estar disponibles para asuntos urgentes fuera del horario de [place], no estamos disponibles las 24 horas del da, los 7 [address]  [address] de la [place].   Si tiene un problema urgente y no puede comunicarse con nosotros, puede optar por buscar atencin mdica  en el consultorio de su doctor(a), en una clnica privada, en un centro de atencin urgente o en una sala de emergencias.  Si tiene Engineer, drilling, por favor llame inmediatamente al 911 o vaya a la sala de emergencias.  Nmeros de bper  - Dr. Gwen Pounds: [phone]  - Dra. Roseanne Reno: [phone]  - Dr. Katrinka Blazing: [phone]   En caso de inclemencias del tiempo, por favor llame a Lacy Duverney principal al [phone] para una actualizacin sobre el Outlook de cualquier retraso o cierre.  Consejos para la medicacin en dermatologa: Por favor, guarde las cajas en las que vienen los medicamentos de uso tpico para ayudarle a seguir las instrucciones sobre dnde y cmo usarlos. Las farmacias generalmente imprimen las instrucciones del medicamento slo en las cajas y no directamente en los tubos del [place].   Si su medicamento es muy caro, por favor, pngase en contacto con Rolm Gala llamando al [phone] y presione la opcin 4 o envenos un mensaje a travs de Clinical cytogeneticist.   No podemos decirle cul ser su copago por los medicamentos por adelantado ya que esto es diferente dependiendo de la cobertura de su seguro. Sin embargo, es posible que podamos encontrar un medicamento sustituto a  Audiological scientist un formulario para que el seguro cubra el medicamento que se considera necesario.   Si se requiere una autorizacin previa para que su compaa de seguros [place] su medicamento, por favor permtanos de 1 a 2 das hbiles para completar [address]  Los precios de los medicamentos varan con frecuencia dependiendo del Environmental consultant de dnde se surte la receta y alguna farmacias pueden ofrecer precios ms baratos.  El sitio web www.goodrx.com tiene cupones para medicamentos de Health and safety inspector. Los precios aqu no tienen en cuenta lo que podra costar con la ayuda del seguro (puede ser ms barato con su seguro), pero el sitio web puede darle el precio si no utiliz Tourist information centre manager.  - Puede imprimir el cupn correspondiente y llevarlo con su receta a la farmacia.  - Tambin puede pasar por nuestra oficina durante el horario de atencin regular y Education officer, museum una tarjeta de cupones de GoodRx.  - Si necesita que su receta se enve electrnicamente a una farmacia diferente, informe  a nuestra oficina a travs de MyChart de [place] o por telfono llamando al [phone] y presione la opcin 4.

## 2023-03-09 ENCOUNTER — Encounter: Payer: BC Managed Care – PPO | Admitting: Dermatology

## 2023-04-27 ENCOUNTER — Telehealth: Payer: Self-pay | Admitting: Family Medicine

## 2023-04-27 MED ORDER — SIMVASTATIN 40 MG PO TABS: ORAL_TABLET | ORAL | 3 refills | Status: AC

## 2023-04-27 NOTE — Telephone Encounter (Signed)
Prescription Request  04/27/2023  LOV: 10/12/2022  What is the name of the medication or equipment?  simvastatin (ZOCOR) 40 MG tablet  Have you contacted your pharmacy to request a refill? Yes   Which pharmacy would you like this sent to?  [id number] [place], [place] - [address] AT [facility] [address] [place] [address] Phone: [phone] Fax: [phone]   Patient notified that their request is being sent to the clinical staff for review and that they should receive a response within 2 business days.   Please advise at Mobile [phone] (mobile)

## 2023-04-27 NOTE — Telephone Encounter (Signed)
Erx sent

## 2023-09-27 ENCOUNTER — Telehealth: Payer: Self-pay | Admitting: *Deleted

## 2023-09-27 DIAGNOSIS — E7800 Pure hypercholesterolemia, unspecified: Secondary | ICD-10-CM

## 2023-09-27 NOTE — Telephone Encounter (Signed)
-----   Message from Alvina Chou sent at 09/26/2023  9:58 AM EDT ----- Regarding: Lab orders for Tue, 3.25.25 Patient is scheduled for CPX labs, please order future labs, Thanks , Camelia Eng

## 2023-10-11 ENCOUNTER — Other Ambulatory Visit (INDEPENDENT_AMBULATORY_CARE_PROVIDER_SITE_OTHER): Payer: BC Managed Care – PPO

## 2023-10-11 DIAGNOSIS — E7800 Pure hypercholesterolemia, unspecified: Secondary | ICD-10-CM

## 2023-10-11 LAB — COMPREHENSIVE METABOLIC PANEL
AG Ratio: 1.7 (calc) (ref 1.0–2.5)
ALT: 12 U/L (ref 6–29)
AST: 16 U/L (ref 10–35)
Albumin: 3.8 g/dL (ref 3.6–5.1)
Alkaline phosphatase (APISO): 58 U/L (ref 37–153)
BUN: 12 mg/dL (ref 7–25)
CO2: 26 mmol/L (ref 20–32)
Calcium: 9.1 mg/dL (ref 8.6–10.4)
Chloride: 107 mmol/L (ref 98–110)
Creat: 0.73 mg/dL (ref 0.50–1.05)
Globulin: 2.3 g/dL (ref 1.9–3.7)
Glucose, Bld: 93 mg/dL (ref 65–99)
Potassium: 4.1 mmol/L (ref 3.5–5.3)
Sodium: 141 mmol/L (ref 135–146)
Total Bilirubin: 0.8 mg/dL (ref 0.2–1.2)
Total Protein: 6.1 g/dL (ref 6.1–8.1)
eGFR: 94 mL/min/{1.73_m2} (ref >=60)

## 2023-10-11 LAB — LIPID PANEL
Cholesterol: 200 mg/dL — ABNORMAL HIGH (ref <200)
HDL: 48 mg/dL — ABNORMAL LOW (ref >=50)
LDL Cholesterol (Calc): 132 mg/dL — ABNORMAL HIGH
Non-HDL Cholesterol (Calc): 152 mg/dL — ABNORMAL HIGH (ref <130)
Total CHOL/HDL Ratio: 4.2 (calc) (ref <5.0)
Triglycerides: 102 mg/dL (ref <150)

## 2023-10-11 NOTE — Addendum Note (Signed)
 Addended by: Alvina Chou on: 10/11/2023 09:01 AM   Modules accepted: Orders

## 2023-10-12 ENCOUNTER — Encounter: Payer: Self-pay | Admitting: Family Medicine

## 2023-10-12 NOTE — Progress Notes (Signed)
 No critical labs need to be addressed urgently. We will discuss labs in detail at upcoming office visit.

## 2023-10-18 ENCOUNTER — Other Ambulatory Visit (HOSPITAL_COMMUNITY)
Admission: RE | Admit: 2023-10-18 | Discharge: 2023-10-18 | Disposition: A | Discharge status: Home / Self Care | Source: Ambulatory Visit | Attending: Family Medicine | Admitting: Family Medicine

## 2023-10-18 ENCOUNTER — Ambulatory Visit (INDEPENDENT_AMBULATORY_CARE_PROVIDER_SITE_OTHER): Payer: BC Managed Care – PPO | Admitting: Family Medicine

## 2023-10-18 ENCOUNTER — Encounter: Payer: Self-pay | Admitting: Family Medicine

## 2023-10-18 VITALS — BP 110/80 | HR 65 | Temp 98.3°F | Ht 61.5 in | Wt 141.1 lb

## 2023-10-18 DIAGNOSIS — Z124 Encounter for screening for malignant neoplasm of cervix: Secondary | ICD-10-CM | POA: Insufficient documentation

## 2023-10-18 DIAGNOSIS — Z0000 Encounter for general adult medical examination without abnormal findings: Secondary | ICD-10-CM | POA: Diagnosis not present

## 2023-10-18 DIAGNOSIS — E2839 Other primary ovarian failure: Secondary | ICD-10-CM | POA: Diagnosis not present

## 2023-10-18 DIAGNOSIS — E7800 Pure hypercholesterolemia, unspecified: Secondary | ICD-10-CM | POA: Diagnosis not present

## 2023-10-18 NOTE — Patient Instructions (Addendum)
 Work on Careers information officer exercise and low cholesterol diet.   You have an order for:  []   2D Mammogram  [x]   3D Mammogram  [x]   Bone Density   .mamm  Please call for appointment:  [facility] [address] [place], [place] [address] [phone] [facility] [address] [place], [place] [address] [phone] [facility] [place] [address] [place], [place] [address] [phone] [facility] [place] Bone Density [address] [place], [place] [address] [phone] [facility] [address] [place], [place] [address] [phone]   Make sure to wear two-piece clothing.  No lotions, powders, or deodorants the day of the appointment. Make sure to bring picture ID and insurance card.  Bring list of medications you are currently taking including any supplements.   Schedule your [place] screening mammogram through MyChart!   Log into your MyChart account.  Go to 'Visit' (or 'Appointments' if on mobile App) --> Schedule an Appointment  Under 'Select a Reason for Visit' choose the Mammogram Screening option.  Complete the pre-visit questions and select the time and place that best fits your schedule.

## 2023-10-18 NOTE — Progress Notes (Unsigned)
 Patient ID: [name], female    DOB: Sep 05, 1960, 63 y.o.   MRN: [id number]  This visit was conducted in person.  BP 110/80 (BP Location: Right Arm, Patient Position: Sitting, Cuff Size: Normal)   Pulse 65   Temp 98.3 F (36.8 C) (Temporal)   Ht 5' 1.5" (1.562 m)   Wt 141 lb 2 oz (64 kg)   SpO2 98%   BMI 26.23 kg/m    Wt Readings from Last 3 Encounters:  10/18/23 141 lb 2 oz (64 kg)  10/12/22 148 lb 6 oz (67.3 kg)  10/09/21 146 lb 8 oz (66.5 kg)    CC:  Chief Complaint  Patient presents with  . Annual Exam    Subjective:   HPI: [name] is a 63 y.o. female presenting on 10/18/2023 for Annual Exam  The patient presents for complete physical and review of chronic health problems. He/She also has the following acute concerns today: none  Wt Readings from Last 3 Encounters:  10/18/23 141 lb 2 oz (64 kg)  10/12/22 148 lb 6 oz (67.3 kg)  10/09/21 146 lb 8 oz (66.5 kg)    Elevated Cholesterol:  On simvastatin 40 mg  every other day, very stong family history of high cholesterol. Father with CAD age 40s. Lab Results  Component Value Date   CHOL 200 (H) 10/11/2023   HDL 48 (L) 10/11/2023   LDLCALC 132 (H) 10/11/2023   LDLDIRECT 168.3 07/15/2008   TRIG 102 10/11/2023   CHOLHDL 4.2 10/11/2023  The 10-year ASCVD risk score (Arnett DK, et al., 2019) is: 3%   Values used to calculate the score:     Age: 42 years     Sex: Female     Is Non-Hispanic African American: No     Diabetic: No     Tobacco smoker: No     Systolic Blood Pressure: 110 mmHg     Is BP treated: No     HDL Cholesterol: 48 mg/dL     Total Cholesterol: 200 mg/dL Using medications without problems: none Muscle aches:  none Diet compliance: moderate Exercise: walking daily Other complaints:     Relevant past medical, surgical, family and social history reviewed and updated as indicated. Interim medical history since our last visit reviewed. Allergies and medications reviewed and  updated. Outpatient Medications Prior to Visit  Medication Sig Dispense Refill  . Calcium-Phosphorus-Vitamin D (CALCIUM/D3 ADULT GUMMIES PO) Take 2-3 each by mouth daily. Vitafusion    . OVER THE COUNTER MEDICATION Vita Fusion 500 mg daily    . simvastatin (ZOCOR) 40 MG tablet TAKE 1 TABLET BY MOUTH EVERY OTHER NIGHT 45 tablet 3   No facility-administered medications prior to visit.     Per HPI unless specifically indicated in ROS section below Review of Systems  Constitutional:  Negative for fatigue and fever.  HENT:  Negative for congestion.   Eyes:  Negative for pain.  Respiratory:  Negative for cough and shortness of breath.   Cardiovascular:  Negative for chest pain, palpitations and leg swelling.  Gastrointestinal:  Negative for abdominal pain.  Genitourinary:  Negative for dysuria and vaginal bleeding.  Musculoskeletal:  Negative for back pain.  Neurological:  Negative for syncope, light-headedness and headaches.  Psychiatric/Behavioral:  Negative for dysphoric mood.    Objective:  BP 110/80 (BP Location: Right Arm, Patient Position: Sitting, Cuff Size: Normal)   Pulse 65   Temp 98.3 F (36.8 C) (Temporal)   Ht 5' 1.5" (1.562 m)  Wt 141 lb 2 oz (64 kg)   SpO2 98%   BMI 26.23 kg/m   Wt Readings from Last 3 Encounters:  10/18/23 141 lb 2 oz (64 kg)  10/12/22 148 lb 6 oz (67.3 kg)  10/09/21 146 lb 8 oz (66.5 kg)      Physical Exam Vitals reviewed.  Constitutional:      General: She is not in acute distress.    Appearance: Normal appearance. She is well-developed. She is not ill-appearing or toxic-appearing.  HENT:     Head: Normocephalic.     Right Ear: Hearing, tympanic membrane, ear canal and external ear normal.     Left Ear: Hearing, tympanic membrane, ear canal and external ear normal.     Nose: Nose normal.  Eyes:     General: Lids are normal. Lids are everted, no foreign bodies appreciated.     Conjunctiva/sclera: Conjunctivae normal.     Pupils: Pupils  are equal, round, and reactive to light.  Neck:     Thyroid: No thyroid mass or thyromegaly.     Vascular: No carotid bruit.     Trachea: Trachea normal.  Cardiovascular:     Rate and Rhythm: Normal rate and regular rhythm.     Heart sounds: Normal heart sounds, S1 normal and S2 normal. No murmur heard.    No gallop.  Pulmonary:     Effort: Pulmonary effort is normal. No respiratory distress.     Breath sounds: Normal breath sounds. No wheezing, rhonchi or rales.  Abdominal:     General: Bowel sounds are normal. There is no distension or abdominal bruit.     Palpations: Abdomen is soft. There is no fluid wave or mass.     Tenderness: There is no abdominal tenderness. There is no guarding or rebound.     Hernia: No hernia is present.  Musculoskeletal:     Cervical back: Normal range of motion and neck supple.  Lymphadenopathy:     Cervical: No cervical adenopathy.  Skin:    General: Skin is warm and dry.     Findings: No rash.  Neurological:     Mental Status: She is alert.     Cranial Nerves: No cranial nerve deficit.     Sensory: No sensory deficit.  Psychiatric:        Mood and Affect: Mood is not anxious or depressed.        Speech: Speech normal.        Behavior: Behavior normal. Behavior is cooperative.        Judgment: Judgment normal.      Results for orders placed or performed in visit on 10/11/23  Lipid panel   Collection Time: 10/11/23  9:01 AM  Result Value Ref Range   Cholesterol 200 (H) <200 mg/dL   HDL 48 (L) > OR = 50 mg/dL   Triglycerides 621 <308 mg/dL   LDL Cholesterol (Calc) 132 (H) mg/dL (calc)   Total CHOL/HDL Ratio 4.2 <5.0 (calc)   Non-HDL Cholesterol (Calc) 152 (H) <130 mg/dL (calc)  Comprehensive metabolic panel   Collection Time: 10/11/23  9:01 AM  Result Value Ref Range   Glucose, Bld 93 65 - 99 mg/dL   BUN 12 7 - 25 mg/dL   Creat 6.57 8.46 - 9.62 mg/dL   eGFR 94 > OR = 60 XB/MWU/1.32G4   BUN/Creatinine Ratio SEE NOTE: 6 - 22 (calc)    Sodium 141 135 - 146 mmol/L   Potassium 4.1 3.5 - 5.3 mmol/L  Chloride 107 98 - 110 mmol/L   CO2 26 20 - 32 mmol/L   Calcium 9.1 8.6 - 10.4 mg/dL   Total Protein 6.1 6.1 - 8.1 g/dL   Albumin 3.8 3.6 - 5.1 g/dL   Globulin 2.3 1.9 - 3.7 g/dL (calc)   AG Ratio 1.7 1.0 - 2.5 (calc)   Total Bilirubin 0.8 0.2 - 1.2 mg/dL   Alkaline phosphatase (APISO) 58 37 - 153 U/L   AST 16 10 - 35 U/L   ALT 12 6 - 29 U/L    This visit occurred during the SARS-CoV-2 public health emergency.  Safety protocols were in place, including screening questions prior to the visit, additional usage of staff PPE, and extensive cleaning of exam room while observing appropriate contact time as indicated for disinfecting solutions.   COVID 19 screen:  No recent travel or known exposure to COVID19 The patient denies respiratory symptoms of COVID 19 at this time. The importance of social distancing was discussed today.   Assessment and Plan   The patient's preventative maintenance and recommended screening tests for an annual wellness exam were reviewed in full today. Brought up to date unless services declined.  Counselled on the importance of diet, exercise, and its role in overall health and mortality. The patient's FH and SH was reviewed, including their home life, tobacco status, and drug and alcohol status.   Continue DVE yearly given uterus still present.  S/P BSO,  Ovary eval not indicated. Has cervix PAPs every 5 years, last nml 2020, neg HPV   DUE Mammo: 2008 breast cancer, Released by ONC  nml 01/2023 DEXA: 10/2012: osteopenia (but was high risk since on Arimidex, now off arimidex for 3 years, she was on fosamax ( was on for almost 2 years) to prevent deterioration, but has stopped now)  Stable DEXA in   11/2014 osteopenia, normal in 2018 and 2020.. repeat in 5 years. DUE Vaccines: COVID x 2 , uptodate tdap, consider shingrix. Refused HIV screen.  Hep C screen: done    Colonoscopy 05/2020 polyp, Dr. Russella Dar  repeat in 7 years  Problem List Items Addressed This Visit     HYPERCHOLESTEROLEMIA   Chronic, moderately well controlled  Using treadmill daily. Heart healthy diet. Continue simvastatin 40 mg every other day. Refills given for 1 year      Other Visit Diagnoses       Routine general medical examination at a health care facility    -  Primary     Cervical cancer screening       Relevant Orders   Cytology - PAP()     Estrogen deficiency       Relevant Orders   DG Bone Density     No orders of the defined types were placed in this encounter.   [name], MD

## 2023-10-18 NOTE — Assessment & Plan Note (Addendum)
 Chronic, moderately well controlled  Using treadmill daily. Heart healthy diet. Continue simvastatin 40 mg every other day. Refills given for 1 year

## 2023-10-19 ENCOUNTER — Other Ambulatory Visit: Payer: Self-pay | Admitting: Family Medicine

## 2023-10-19 DIAGNOSIS — Z1231 Encounter for screening mammogram for malignant neoplasm of breast: Secondary | ICD-10-CM

## 2023-10-20 ENCOUNTER — Encounter: Payer: Self-pay | Admitting: Family Medicine

## 2023-10-20 LAB — CYTOLOGY - PAP
Adequacy: ABSENT
Comment: NEGATIVE
Diagnosis: NEGATIVE
High risk HPV: NEGATIVE

## 2024-01-18 ENCOUNTER — Ambulatory Visit

## 2024-02-20 ENCOUNTER — Ambulatory Visit

## 2024-02-23 ENCOUNTER — Ambulatory Visit
Admission: RE | Admit: 2024-02-23 | Discharge: 2024-02-23 | Disposition: A | Discharge status: Home / Self Care | Source: Ambulatory Visit | Attending: Family Medicine | Admitting: Family Medicine

## 2024-02-23 DIAGNOSIS — Z1231 Encounter for screening mammogram for malignant neoplasm of breast: Secondary | ICD-10-CM

## 2024-02-27 ENCOUNTER — Ambulatory Visit: Payer: Self-pay | Admitting: Family Medicine

## 2024-03-06 ENCOUNTER — Encounter: Payer: Self-pay | Admitting: Dermatology

## 2024-03-06 ENCOUNTER — Ambulatory Visit (INDEPENDENT_AMBULATORY_CARE_PROVIDER_SITE_OTHER): Payer: BC Managed Care – PPO | Admitting: Dermatology

## 2024-03-06 DIAGNOSIS — L814 Other melanin hyperpigmentation: Secondary | ICD-10-CM

## 2024-03-06 DIAGNOSIS — L570 Actinic keratosis: Secondary | ICD-10-CM

## 2024-03-06 DIAGNOSIS — D2371 Other benign neoplasm of skin of right lower limb, including hip: Secondary | ICD-10-CM | POA: Diagnosis not present

## 2024-03-06 DIAGNOSIS — L821 Other seborrheic keratosis: Secondary | ICD-10-CM

## 2024-03-06 DIAGNOSIS — Z1283 Encounter for screening for malignant neoplasm of skin: Secondary | ICD-10-CM | POA: Diagnosis not present

## 2024-03-06 DIAGNOSIS — W908XXA Exposure to other nonionizing radiation, initial encounter: Secondary | ICD-10-CM

## 2024-03-06 DIAGNOSIS — D1801 Hemangioma of skin and subcutaneous tissue: Secondary | ICD-10-CM

## 2024-03-06 DIAGNOSIS — L578 Other skin changes due to chronic exposure to nonionizing radiation: Secondary | ICD-10-CM

## 2024-03-06 DIAGNOSIS — D239 Other benign neoplasm of skin, unspecified: Secondary | ICD-10-CM

## 2024-03-06 DIAGNOSIS — D485 Neoplasm of uncertain behavior of skin: Secondary | ICD-10-CM

## 2024-03-06 DIAGNOSIS — D229 Melanocytic nevi, unspecified: Secondary | ICD-10-CM

## 2024-03-06 NOTE — Progress Notes (Signed)
 Follow-Up Visit   Subjective  [name] is a 63 y.o. female who presents for the following: Skin Cancer Screening and Full Body Skin Exam  The patient presents for Total-Body Skin Exam (TBSE) for skin cancer screening and mole check. The patient has spots, moles and lesions to be evaluated, some may be new or changing and the patient may have concern these could be cancer.  Pt concerned about lesion on the roof of the mouth, she has had gum surgery in the past.  The following portions of the chart were reviewed this encounter and updated as appropriate: medications, allergies, medical history  Review of Systems:  No other skin or systemic complaints except as noted in HPI or Assessment and Plan.  Objective  Well appearing patient in no apparent distress; mood and affect are within normal limits.  A full examination was performed including scalp, head, eyes, ears, nose, lips, neck, chest, axillae, abdomen, back, buttocks, bilateral upper extremities, bilateral lower extremities, hands, feet, fingers, toes, fingernails, and toenails. All findings within normal limits unless otherwise noted below.   Exam of toenails limited by presence of nail polish.  Relevant physical exam findings are noted in the Assessment and Plan.  L lat shoulder x 1, central chest x 3 (4) Erythematous thin papules/macules with gritty scale.   Assessment & Plan   SKIN CANCER SCREENING PERFORMED TODAY.  ACTINIC DAMAGE - Chronic condition, secondary to cumulative UV/sun exposure - diffuse scaly erythematous macules with underlying dyspigmentation - Recommend daily broad spectrum sunscreen SPF 30+ to sun-exposed areas, reapply every 2 hours as needed.  - Staying in the shade or wearing long sleeves, sun glasses (UVA+UVB protection) and wide brim hats (4-inch brim around the entire circumference of the hat) are also recommended for sun protection.  - Call for new or changing lesions.  LENTIGINES,  SEBORRHEIC KERATOSES, HEMANGIOMAS - Benign normal skin lesions - Benign-appearing - Call for any changes  MELANOCYTIC NEVI - Tan-brown and/or pink-flesh-colored symmetric macules and papules - Benign appearing on exam today - Observation - Call clinic for new or changing moles - Recommend daily use of broad spectrum spf 30+ sunscreen to sun-exposed areas.   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 Cannot exclude early evolving LM.  Excised 08/30/2018. Margins free.  - No evidence of recurrence today - Recommend regular full body skin exams - Recommend daily broad spectrum sunscreen SPF 30+ to sun-exposed areas, reapply every 2 hours as needed.  - Call if any new or changing lesions are noted between office visits - No LAD.  Neoplasm of uncertain behavior, roof of mouth - pt to follow up with her dentist for evaluation. She has a routine appointment scheduled for November.  AK (ACTINIC KERATOSIS) (4) L lat shoulder x 1, central chest x 3 (4) Actinic keratoses are precancerous spots that appear secondary to cumulative UV radiation exposure/sun exposure over time. They are chronic with expected duration over 1 year. A portion of actinic keratoses will progress to squamous cell carcinoma of the skin. It is not possible to reliably predict which spots will progress to skin cancer and so treatment is recommended to prevent development of skin cancer.  Recommend daily broad spectrum sunscreen SPF 30+ to sun-exposed areas, reapply every 2 hours as needed.  Recommend staying in the shade or wearing long sleeves, sun glasses (UVA+UVB protection) and wide brim hats (4-inch brim around the entire circumference of the hat). Call for new or changing lesions.  Destruction of lesion - L lat  shoulder x 1, central chest x 3 (4) Complexity: simple   Destruction method: cryotherapy   Informed consent: discussed and consent obtained   Timeout:  patient name, date of birth, surgical site, and procedure verified Lesion  destroyed using liquid nitrogen: Yes   Region frozen until ice ball extended beyond lesion: Yes   Cryo cycles: 1 or 2. Outcome: patient tolerated procedure well with no complications   Post-procedure details: wound care instructions given    MULTIPLE BENIGN NEVI   LENTIGINES   ACTINIC ELASTOSIS   DERMATOFIBROMA   SEBORRHEIC KERATOSES   CHERRY ANGIOMA    DERMATOFIBROMA Exam: Firm pink/brown papulenodule with dimple sign. R post foot Treatment Plan: A dermatofibroma is a benign growth possibly related to trauma, such as an insect bite, cut from shaving, or inflamed acne-type bump.  Treatment options to remove include shave or excision with resulting scar and risk of recurrence.  Since benign-appearing and not bothersome, will observe for now.   Return in about 1 year (around 03/06/2025) for TBSE - hx AMP, AKs.  I, Rosina Mayans, CMA, am acting as scribe for Boneta Sharps, MD .  Documentation: I have reviewed the above documentation for accuracy and completeness, and I agree with the above.  Boneta Sharps, MD

## 2024-03-06 NOTE — Patient Instructions (Signed)

## 2024-04-02 ENCOUNTER — Ambulatory Visit (HOSPITAL_BASED_OUTPATIENT_CLINIC_OR_DEPARTMENT_OTHER)
Admission: RE | Admit: 2024-04-02 | Discharge: 2024-04-02 | Disposition: A | Discharge status: Home / Self Care | Source: Ambulatory Visit | Attending: Family Medicine | Admitting: Family Medicine

## 2024-04-02 DIAGNOSIS — E2839 Other primary ovarian failure: Secondary | ICD-10-CM | POA: Diagnosis present

## 2024-04-03 ENCOUNTER — Ambulatory Visit: Payer: Self-pay | Admitting: Family Medicine

## 2024-04-03 DIAGNOSIS — M8580 Other specified disorders of bone density and structure, unspecified site: Secondary | ICD-10-CM

## 2024-04-03 NOTE — Assessment & Plan Note (Signed)
 On bone density September 2025 T-1.4 in hip

## 2024-06-27 ENCOUNTER — Other Ambulatory Visit

## 2024-10-10 ENCOUNTER — Other Ambulatory Visit

## 2024-10-19 ENCOUNTER — Encounter: Admitting: Family Medicine

## 2025-03-07 ENCOUNTER — Ambulatory Visit: Admitting: Dermatology
